# Patient Record
Sex: Female | Born: 1990 | Race: Black or African American | Hispanic: No | Marital: Single | Smoking: Former smoker
Health system: Southern US, Community
[De-identification: ages and names within clinical notes are randomized; demographics above are authoritative.]

## PROBLEM LIST (undated history)

## (undated) ENCOUNTER — Emergency Department (HOSPITAL_COMMUNITY): Disposition: A | Discharge status: Skilled Nursing Facility

## (undated) ENCOUNTER — Inpatient Hospital Stay (HOSPITAL_COMMUNITY): Payer: Self-pay

## (undated) DIAGNOSIS — T7840XA Allergy, unspecified, initial encounter: Secondary | ICD-10-CM

## (undated) HISTORY — DX: Allergy, unspecified, initial encounter: T78.40XA

---
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
## 2010-08-21 LAB — CBC
HCT: 28 % — ABNORMAL LOW (ref 36.0–46.0)
HCT: 41.6 % (ref 36.0–46.0)
Hemoglobin: 13.8 g/dL (ref 12.0–15.0)
Hemoglobin: 9.4 g/dL — ABNORMAL LOW (ref 12.0–15.0)
MCHC: 33.3 g/dL (ref 30.0–36.0)
MCHC: 33.5 g/dL (ref 30.0–36.0)
MCV: 95.6 fL (ref 78.0–100.0)
MCV: 96.3 fL (ref 78.0–100.0)
Platelets: 134 10*3/uL — ABNORMAL LOW (ref 150–400)
RBC: 2.91 MIL/uL — ABNORMAL LOW (ref 3.87–5.11)
RBC: 4.35 MIL/uL (ref 3.87–5.11)
RDW: 13.7 % (ref 11.5–15.5)
WBC: 10.5 10*3/uL (ref 4.0–10.5)

## 2010-08-21 LAB — CCBB MATERNAL DONOR DRAW

## 2010-11-20 IMAGING — US US OB FOLLOW-UP
1 series · 14 of 23 positions shown · non-contrast
Comparison: none

OBSTETRICAL ULTRASOUND:
 This ultrasound exam was performed in the [HOSPITAL] Ultrasound Department.  The OB US report was generated in the AS system, and faxed to the ordering physician.  This report is also available in [HOSPITAL]?s [employer] and in [REDACTED] PACS.

[Series 1: us ob follow up · 0.27mm/px · 23 acquisitions, 14 frames shown]
[im 1/23]
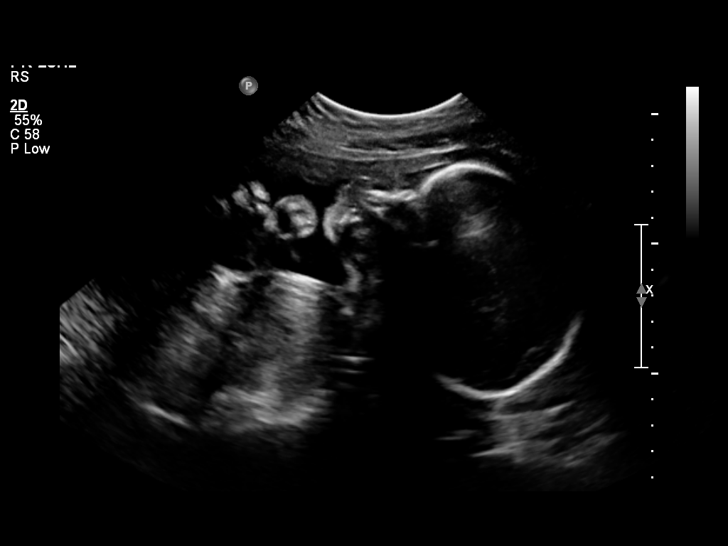
[im 3/23]
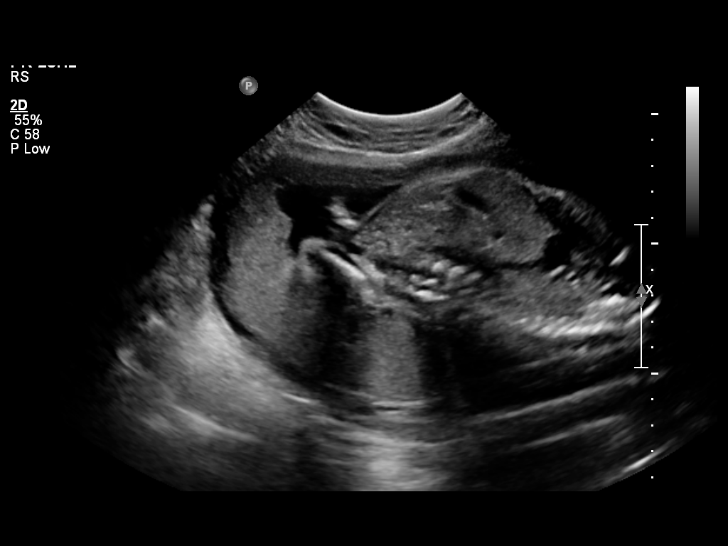
[im 5/23]
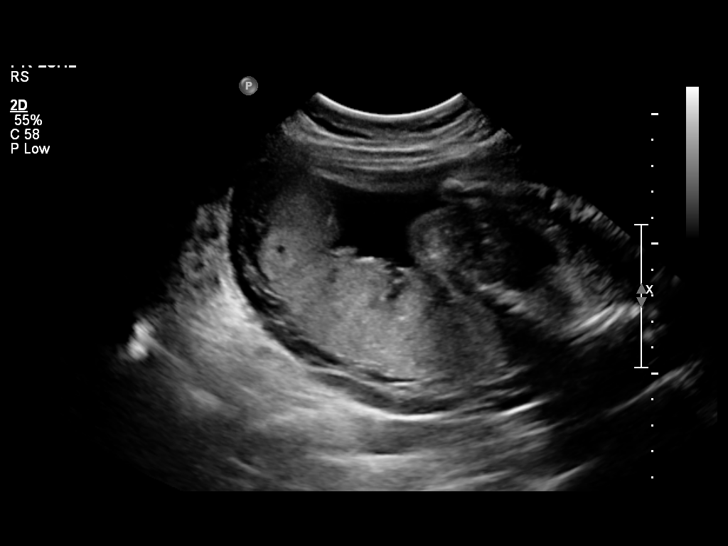
[im 6/23]
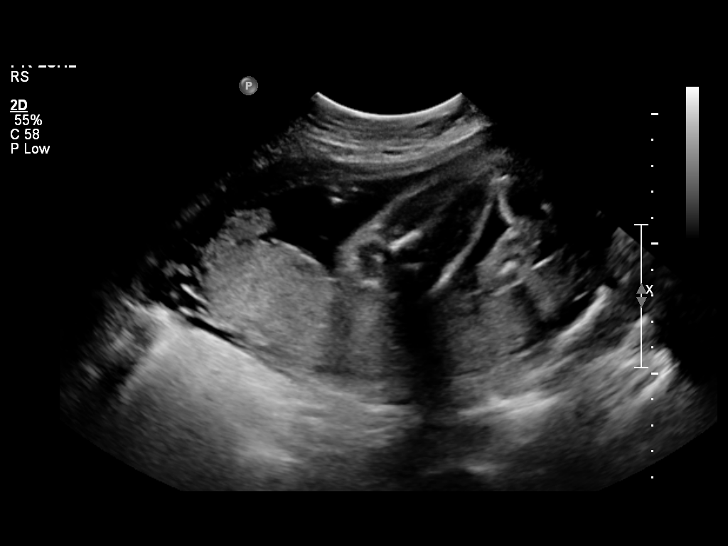
[im 8/23]
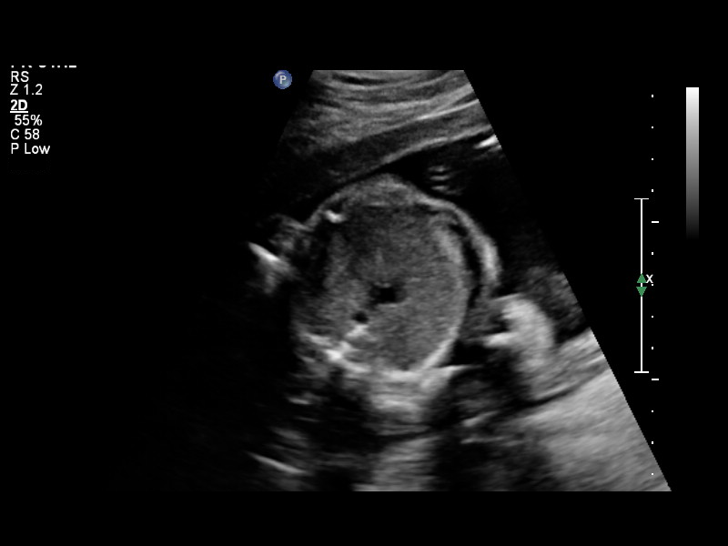
[im 10/23]
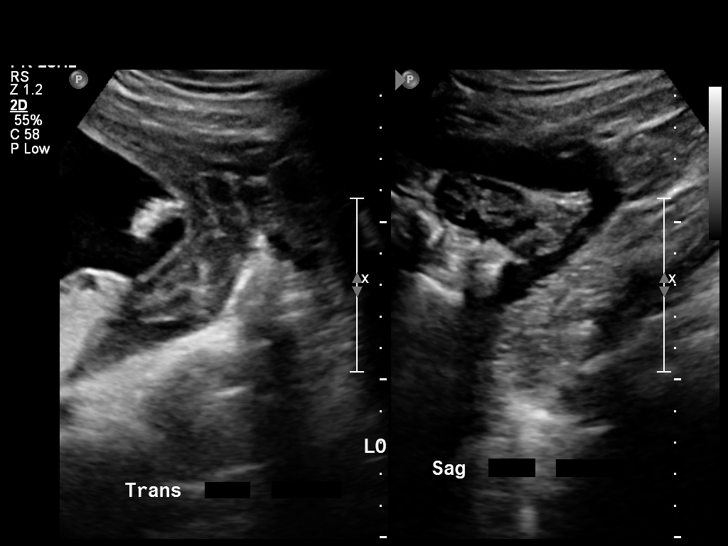
[im 11/23]
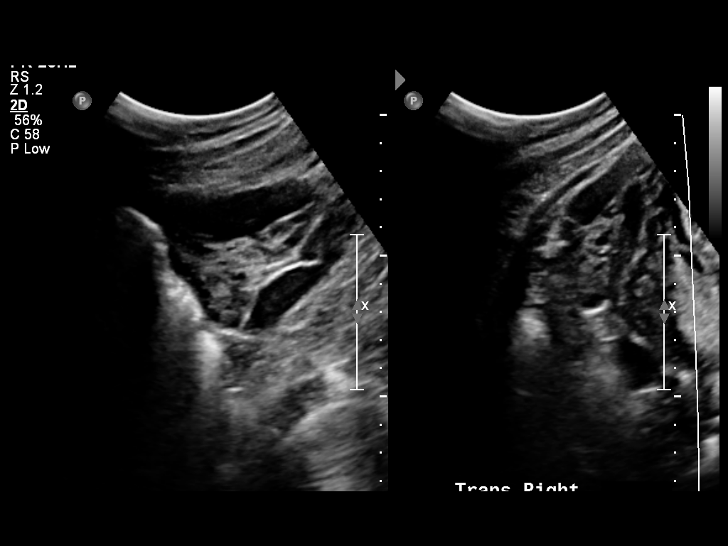
[im 13/23]
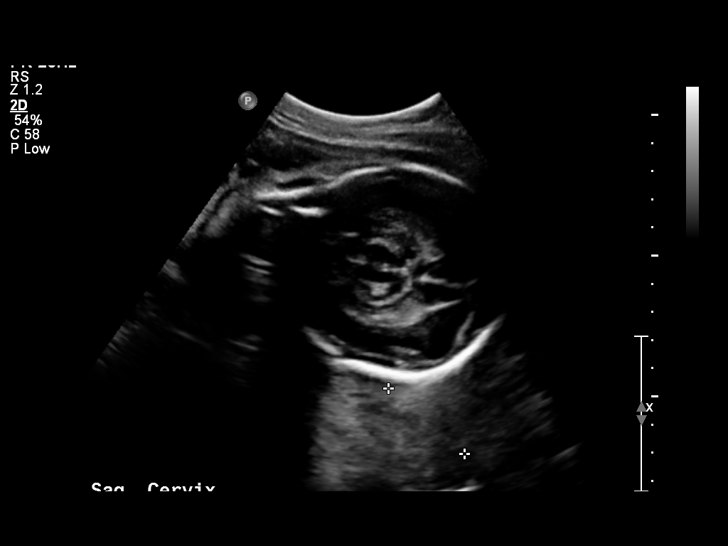
[im 14/23]
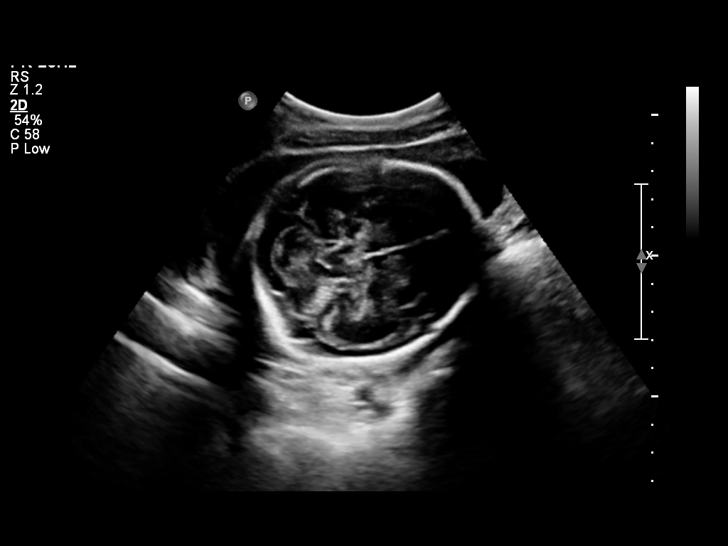
[im 16/23]
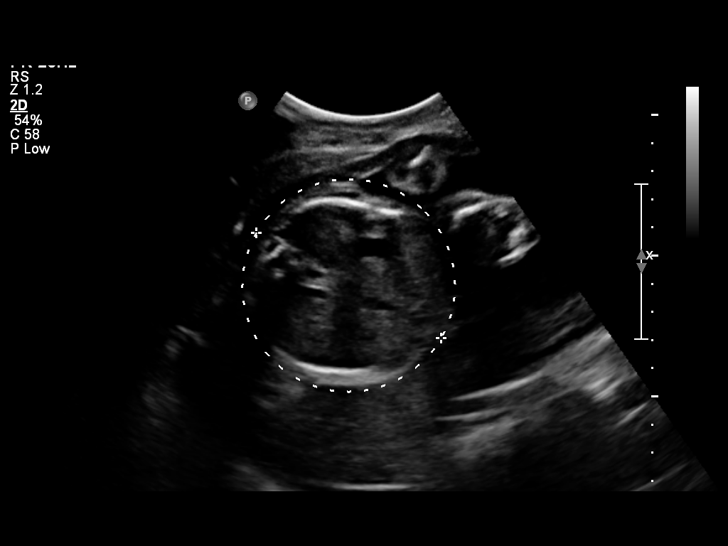
[im 18/23]
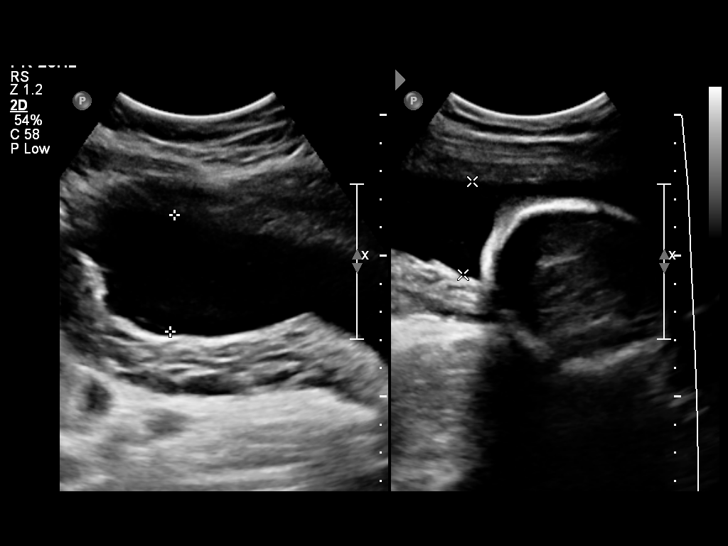
[im 19/23]
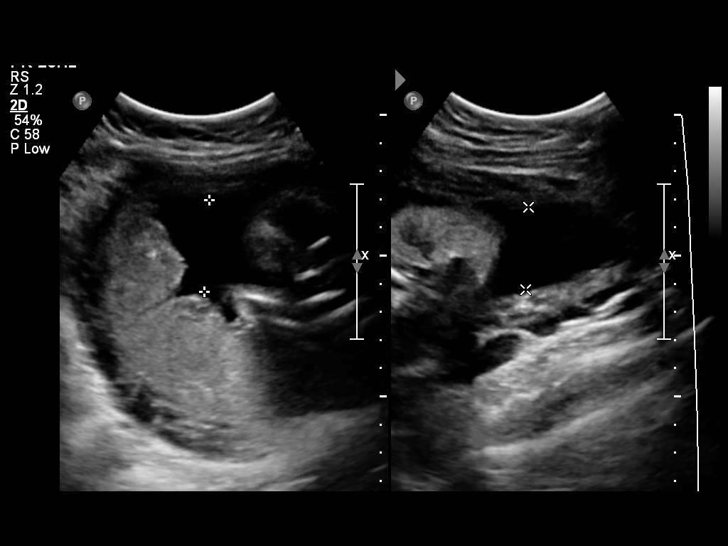
[im 21/23]
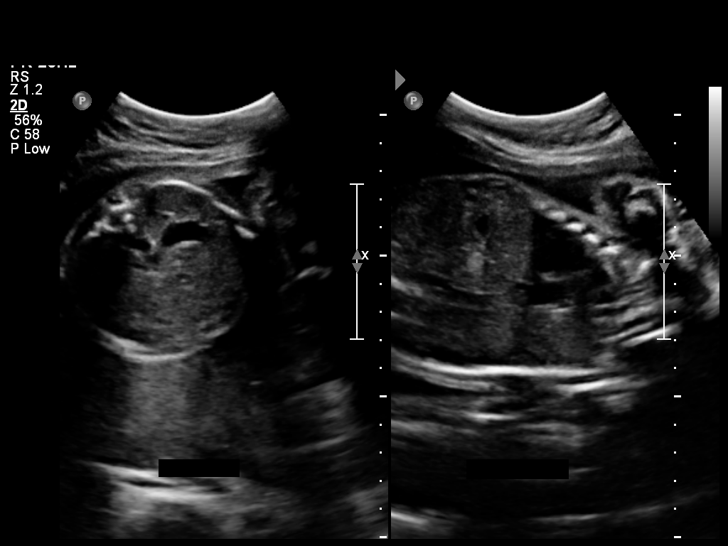
[im 23/23]
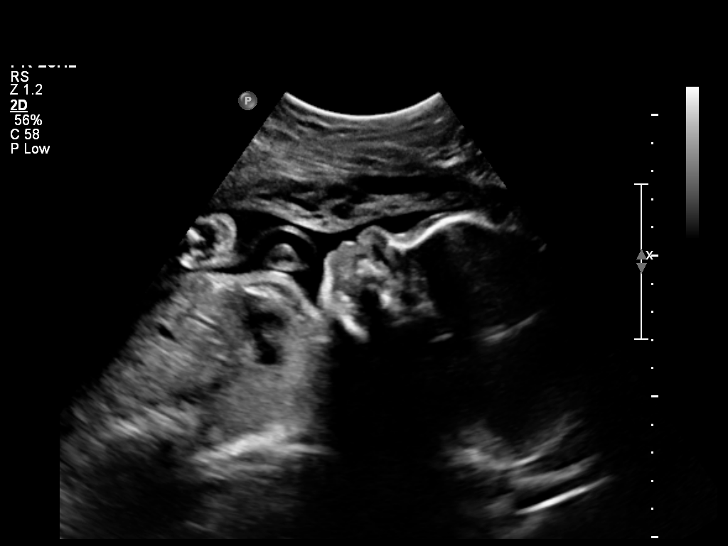

[14 of 23 positions shown; findings below may reference images not displayed]

IMPRESSION: See [facility] Obstetric US report.

## 2011-06-08 ENCOUNTER — Encounter: Payer: Self-pay | Admitting: Emergency Medicine

## 2011-06-08 ENCOUNTER — Emergency Department (HOSPITAL_COMMUNITY)
Admission: EM | Admit: 2011-06-08 | Discharge: 2011-06-09 | Disposition: A | Discharge status: Home / Self Care | Payer: Self-pay | Attending: Emergency Medicine | Admitting: Emergency Medicine

## 2011-06-08 DIAGNOSIS — R197 Diarrhea, unspecified: Secondary | ICD-10-CM | POA: Insufficient documentation

## 2011-06-08 DIAGNOSIS — R509 Fever, unspecified: Secondary | ICD-10-CM | POA: Insufficient documentation

## 2011-06-08 DIAGNOSIS — R112 Nausea with vomiting, unspecified: Secondary | ICD-10-CM | POA: Insufficient documentation

## 2011-06-08 DIAGNOSIS — F17200 Nicotine dependence, unspecified, uncomplicated: Secondary | ICD-10-CM | POA: Insufficient documentation

## 2011-06-08 DIAGNOSIS — N390 Urinary tract infection, site not specified: Secondary | ICD-10-CM | POA: Insufficient documentation

## 2011-06-08 DIAGNOSIS — R1084 Generalized abdominal pain: Secondary | ICD-10-CM | POA: Insufficient documentation

## 2011-06-08 DIAGNOSIS — M549 Dorsalgia, unspecified: Secondary | ICD-10-CM | POA: Insufficient documentation

## 2011-06-08 DIAGNOSIS — R300 Dysuria: Secondary | ICD-10-CM | POA: Insufficient documentation

## 2011-06-08 LAB — URINALYSIS, ROUTINE W REFLEX MICROSCOPIC
Bilirubin Urine: NEGATIVE
Glucose, UA: NEGATIVE mg/dL
Ketones, ur: >80 mg/dL — AB
Nitrite: POSITIVE — AB
Protein, ur: >300 mg/dL — AB

## 2011-06-08 LAB — HEPATIC FUNCTION PANEL
ALT: 19 U/L (ref 0–35)
Bilirubin, Direct: 0.1 mg/dL (ref 0.0–0.3)
Indirect Bilirubin: 0.3 mg/dL (ref 0.3–0.9)
Total Bilirubin: 0.4 mg/dL (ref 0.3–1.2)

## 2011-06-08 LAB — BASIC METABOLIC PANEL
BUN: 5 mg/dL — ABNORMAL LOW (ref 6–23)
CO2: 25 mEq/L (ref 19–32)
Calcium: 9.5 mg/dL (ref 8.4–10.5)
Creatinine, Ser: 0.7 mg/dL (ref 0.50–1.10)
Glucose, Bld: 104 mg/dL — ABNORMAL HIGH (ref 70–99)

## 2011-06-08 LAB — CBC
MCH: 32.2 pg (ref 26.0–34.0)
MCHC: 34.8 g/dL (ref 30.0–36.0)
MCV: 92.7 fL (ref 78.0–100.0)
Platelets: 189 10*3/uL (ref 150–400)
RDW: 12.6 % (ref 11.5–15.5)

## 2011-06-08 LAB — DIFFERENTIAL
Basophils Absolute: 0 10*3/uL (ref 0.0–0.1)
Eosinophils Absolute: 0 10*3/uL (ref 0.0–0.7)
Eosinophils Relative: 0 % (ref 0–5)
Monocytes Absolute: 1.1 10*3/uL — ABNORMAL HIGH (ref 0.1–1.0)

## 2011-06-08 LAB — URINE MICROSCOPIC-ADD ON

## 2011-06-08 MED ORDER — SODIUM CHLORIDE 0.9 % IV BOLUS (SEPSIS)
1000.0000 mL | Freq: Once | INTRAVENOUS | Status: AC
  Administered 2011-06-08: 1000 mL via INTRAVENOUS

## 2011-06-08 MED ORDER — ONDANSETRON HCL 4 MG/2ML IJ SOLN
4.0000 mg | Freq: Once | INTRAMUSCULAR | Status: AC
  Administered 2011-06-08: 4 mg via INTRAVENOUS
  Filled 2011-06-08: qty 2

## 2011-06-08 MED ORDER — DEXTROSE 5 % IV SOLN
1.0000 g | Freq: Once | INTRAVENOUS | Status: AC
  Administered 2011-06-08: 1 g via INTRAVENOUS
  Filled 2011-06-08: qty 10

## 2011-06-08 MED ORDER — MORPHINE SULFATE 4 MG/ML IJ SOLN: 4.0000 mg | Freq: Once | INTRAMUSCULAR | Status: DC

## 2011-06-08 MED ORDER — MORPHINE SULFATE 4 MG/ML IJ SOLN
4.0000 mg | Freq: Once | INTRAMUSCULAR | Status: AC
  Administered 2011-06-08: 4 mg via INTRAVENOUS
  Filled 2011-06-08: qty 1

## 2011-06-08 MED ORDER — ACETAMINOPHEN 325 MG PO TABS
650.0000 mg | ORAL_TABLET | Freq: Once | ORAL | Status: AC
  Administered 2011-06-08: 650 mg via ORAL
  Filled 2011-06-08: qty 2

## 2011-06-08 NOTE — ED Provider Notes (Signed)
History     CSN: [id number]  Arrival date & time 06/08/11  1916   First MD Initiated Contact with Patient 06/08/11 2057      Chief Complaint  Patient presents with  . Abdominal Pain   HPI Patient complains of generalized abdominal pain since last Thursday. Denies any current abdominal pain. Patient reports that she has had some nausea and vomiting. Reports some diarrhea. States that she has a fever. Denies any vaginal bleeding or discharge. Denies any pelvic pain. Patient denies any other complaints. Minimal back pain.   History reviewed. No pertinent past medical history.  History reviewed. No pertinent past surgical history.  No family history on file.  History  Substance Use Topics  . Smoking status: Current Everyday Smoker  . Smokeless tobacco: Not on file  . Alcohol Use: Yes    OB History    Grav Para Term Preterm Abortions TAB SAB Ect Mult Living                  Review of Systems  Constitutional: Positive for fever. Negative for chills, diaphoresis and appetite change.  HENT: Negative for neck pain.   Eyes: Negative for photophobia and visual disturbance.  Respiratory: Negative for cough, chest tightness and shortness of breath.   Cardiovascular: Negative for chest pain.  Gastrointestinal: Positive for nausea, vomiting, abdominal pain and diarrhea. Negative for blood in stool, abdominal distention and anal bleeding.  Genitourinary: Positive for dysuria. Negative for flank pain, vaginal bleeding, vaginal discharge and vaginal pain.  Musculoskeletal: Positive for back pain.  Skin: Negative for rash.  Neurological: Negative for weakness and numbness.  All other systems reviewed and are negative.    Allergies  Review of patient's allergies indicates no known allergies.  Home Medications  No current outpatient prescriptions on file.  BP 116/74  Pulse 120  Temp(Src) 100 F (37.8 C) (Oral)  Resp 18  SpO2 98%  LMP 05/08/2011  Physical Exam    Constitutional: She is oriented to person, place, and time. She appears well-developed and well-nourished. No distress.       Nontoxic appearing  HENT:  Head: Normocephalic and atraumatic.  Right Ear: External ear normal.  Mouth/Throat: Oropharynx is clear and moist.  Eyes: EOM are normal. Pupils are equal, round, and reactive to light.  Neck: Normal range of motion. Neck supple.  Cardiovascular: Normal rate, regular rhythm and normal heart sounds.  Exam reveals no gallop and no friction rub.   No murmur heard. Pulmonary/Chest: Effort normal and breath sounds normal. No respiratory distress. She has no wheezes. She has no rales. She exhibits no tenderness.  Abdominal: Soft. She exhibits no distension and no mass. There is no tenderness. There is no rebound and no guarding.       No pulsatile mass  Musculoskeletal: Normal range of motion. She exhibits no edema and no tenderness.       Negative straight leg test bilateral lower extremities. Baseline ROM, moves extremities with no obvious new focal weakness. Full strength in both lower extremities 5/5. No weakness with extension of great toe, L5 nerve room is not impaired. S1 tested and not impaired with testing ankle jerk reflex, good plantar flexion.  Lymphadenopathy:    She has no cervical adenopathy.  Neurological: She is alert and oriented to person, place, and time. She has normal strength. She displays no atrophy. No cranial nerve deficit or sensory deficit. She exhibits normal muscle tone. She displays a negative Romberg sign. Gait normal. She displays  no Babinski's sign on the right side. She displays no Babinski's sign on the left side.  Reflex Scores:      Patellar reflexes are 2+ on the right side and 2+ on the left side.      Achilles reflexes are 2+ on the right side and 2+ on the left side.       Advised patient of warning signs to return. Patient stated agreement and understanding.  Skin: Skin is warm and dry. No rash noted. She  is not diaphoretic. No erythema. No pallor.  Psychiatric: She has a normal mood and affect. Her behavior is normal. Judgment and thought content normal.    ED Course  Procedures (including critical care time)  Patient seen and evaluated.  VSS reviewed. . Nursing notes reviewed. Discussed with attending physician. Initial testing ordered. Will monitor the patient closely. They agree with the treatment plan and diagnosis. No other complaints.   Results for orders placed during the hospital encounter of 06/08/11  BASIC METABOLIC PANEL      Component Value Range   Sodium 132 (*) 135 - 145 (mEq/L)   Potassium 4.0  3.5 - 5.1 (mEq/L)   Chloride 96  96 - 112 (mEq/L)   CO2 25  19 - 32 (mEq/L)   Glucose, Bld 104 (*) 70 - 99 (mg/dL)   BUN 5 (*) 6 - 23 (mg/dL)   Creatinine, Ser 1.61  0.50 - 1.10 (mg/dL)   Calcium 9.5  8.4 - 09.6 (mg/dL)   GFR calc non Af Amer >90  >90 (mL/min)   GFR calc Af Amer >90  >90 (mL/min)  CBC      Component Value Range   WBC 17.2 (*) 4.0 - 10.5 (K/uL)   RBC 4.22  3.87 - 5.11 (MIL/uL)   Hemoglobin 13.6  12.0 - 15.0 (g/dL)   HCT 04.5  40.9 - 81.1 (%)   MCV 92.7  78.0 - 100.0 (fL)   MCH 32.2  26.0 - 34.0 (pg)   MCHC 34.8  30.0 - 36.0 (g/dL)   RDW 91.4  78.2 - 95.6 (%)   Platelets 189  150 - 400 (K/uL)  DIFFERENTIAL      Component Value Range   Neutrophils Relative 85 (*) 43 - 77 (%)   Neutro Abs 14.6 (*) 1.7 - 7.7 (K/uL)   Lymphocytes Relative 8 (*) 12 - 46 (%)   Lymphs Abs 1.4  0.7 - 4.0 (K/uL)   Monocytes Relative 7  3 - 12 (%)   Monocytes Absolute 1.1 (*) 0.1 - 1.0 (K/uL)   Eosinophils Relative 0  0 - 5 (%)   Eosinophils Absolute 0.0  0.0 - 0.7 (K/uL)   Basophils Relative 0  0 - 1 (%)   Basophils Absolute 0.0  0.0 - 0.1 (K/uL)  URINALYSIS, ROUTINE W REFLEX MICROSCOPIC      Component Value Range   Color, Urine AMBER (*) YELLOW    APPearance TURBID (*) CLEAR    Specific Gravity, Urine 1.024  1.005 - 1.030    pH 6.0  5.0 - 8.0    Glucose, UA NEGATIVE   NEGATIVE (mg/dL)   Hgb urine dipstick SMALL (*) NEGATIVE    Bilirubin Urine NEGATIVE  NEGATIVE    Ketones, ur >80 (*) NEGATIVE (mg/dL)   Protein, ur >213 (*) NEGATIVE (mg/dL)   Urobilinogen, UA 1.0  0.0 - 1.0 (mg/dL)   Nitrite POSITIVE (*) NEGATIVE    Leukocytes, UA MODERATE (*) NEGATIVE   HEPATIC FUNCTION PANEL  Component Value Range   Total Protein 8.1  6.0 - 8.3 (g/dL)   Albumin 3.6  3.5 - 5.2 (g/dL)   AST 18  0 - 37 (U/L)   ALT 19  0 - 35 (U/L)   Alkaline Phosphatase 83  39 - 117 (U/L)   Total Bilirubin 0.4  0.3 - 1.2 (mg/dL)   Bilirubin, Direct 0.1  0.0 - 0.3 (mg/dL)   Indirect Bilirubin 0.3  0.3 - 0.9 (mg/dL)  LIPASE, BLOOD      Component Value Range   Lipase 19  11 - 59 (U/L)  POCT PREGNANCY, URINE      Component Value Range   Preg Test, Ur NEGATIVE    URINE MICROSCOPIC-ADD ON      Component Value Range   Squamous Epithelial / LPF FEW (*) RARE    WBC, UA 21-50  <3 (WBC/hpf)   RBC / HPF 3-6  <3 (RBC/hpf)   Bacteria, UA MANY (*) RARE    Urine-Other MUCOUS PRESENT     No results found.  Patient seen and re-evaluated. Resting comfortably. VSS stable. NAD. Patient notified of testing results. Stated agreement and understanding. Patient stated understanding to treatment plan and diagnosis.    10:24 PM urine pregnancy negative.   MDM  UTI, treated with IV rocephin in the ED. No renal insufficiency. Advised patient to follow up with her primary care physician for recheck UA in one week. BP 102/63  Pulse 95  Temp(Src) 100.1 F (37.8 C) (Oral)  Resp 18  SpO2 100%  LMP 05/08/2011 fever improved.          Demetrius Charity, PA 06/08/11 [phone]

## 2011-06-08 NOTE — ED Notes (Signed)
Pt states she has been having abdominal pain with N/V four the past four days.  Pt states she has been vomiting today with diarrhea about 4 times.  Pt denies taking any medication today.

## 2011-06-08 NOTE — ED Notes (Signed)
PT. REPORTS GENERALIZED ABDOMINAL PAIN ONSET LAST Thursday ,  VOMITTED AND DIARRHEA TODAY WITH HEADACHE AND FEVER.

## 2011-06-09 MED ORDER — OXYCODONE-ACETAMINOPHEN 5-325 MG PO TABS
1.0000 | ORAL_TABLET | ORAL | Status: AC | PRN
Start: 2011-06-09 — End: 2011-06-19

## 2011-06-09 MED ORDER — CIPROFLOXACIN HCL 500 MG PO TABS: 500.0000 mg | ORAL_TABLET | Freq: Two times a day (BID) | ORAL | Status: AC

## 2011-06-09 MED ORDER — PROMETHAZINE HCL 25 MG PO TABS: 25.0000 mg | ORAL_TABLET | Freq: Four times a day (QID) | ORAL | Status: AC | PRN

## 2011-06-20 NOTE — ED Provider Notes (Signed)
Medical screening examination/treatment/procedure(s) were performed by non-physician practitioner and as supervising physician I was immediately available for consultation/collaboration.    Madden Garron L Skarleth Delmonico, MD 06/20/11 2245 

## 2014-11-30 ENCOUNTER — Encounter (HOSPITAL_COMMUNITY): Payer: Self-pay

## 2014-11-30 ENCOUNTER — Emergency Department (HOSPITAL_COMMUNITY)
Admission: EM | Admit: 2014-11-30 | Discharge: 2014-11-30 | Disposition: A | Discharge status: Home / Self Care | Payer: No Typology Code available for payment source | Attending: Emergency Medicine | Admitting: Emergency Medicine

## 2014-11-30 DIAGNOSIS — Z720 Tobacco use: Secondary | ICD-10-CM | POA: Insufficient documentation

## 2014-11-30 DIAGNOSIS — Y998 Other external cause status: Secondary | ICD-10-CM | POA: Insufficient documentation

## 2014-11-30 DIAGNOSIS — Y9389 Activity, other specified: Secondary | ICD-10-CM | POA: Insufficient documentation

## 2014-11-30 DIAGNOSIS — S199XXA Unspecified injury of neck, initial encounter: Secondary | ICD-10-CM | POA: Diagnosis present

## 2014-11-30 DIAGNOSIS — M5489 Other dorsalgia: Secondary | ICD-10-CM

## 2014-11-30 DIAGNOSIS — S161XXA Strain of muscle, fascia and tendon at neck level, initial encounter: Secondary | ICD-10-CM | POA: Insufficient documentation

## 2014-11-30 DIAGNOSIS — Y92410 Unspecified street and highway as the place of occurrence of the external cause: Secondary | ICD-10-CM | POA: Diagnosis not present

## 2014-11-30 DIAGNOSIS — S3992XA Unspecified injury of lower back, initial encounter: Secondary | ICD-10-CM | POA: Insufficient documentation

## 2014-11-30 MED ORDER — NAPROXEN 250 MG PO TABS
500.0000 mg | ORAL_TABLET | Freq: Once | ORAL | Status: AC
  Administered 2014-11-30: 500 mg via ORAL
  Filled 2014-11-30: qty 2

## 2014-11-30 MED ORDER — METHOCARBAMOL 500 MG PO TABS
500.0000 mg | ORAL_TABLET | Freq: Once | ORAL | Status: AC
  Administered 2014-11-30: 500 mg via ORAL
  Filled 2014-11-30: qty 1

## 2014-11-30 MED ORDER — NAPROXEN 500 MG PO TABS: 500.0000 mg | ORAL_TABLET | Freq: Two times a day (BID) | ORAL | Status: DC

## 2014-11-30 MED ORDER — METHOCARBAMOL 500 MG PO TABS: 500.0000 mg | ORAL_TABLET | Freq: Two times a day (BID) | ORAL | Status: DC
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
## 2014-11-30 NOTE — Discharge Instructions (Signed)
1. Medications: robaxin, naproxyn, usual home medications 2. Treatment: rest, drink plenty of fluids, gentle stretching as discussed, alternate ice and heat 3. Follow Up: Please followup with your primary doctor in 3 days for discussion of your diagnoses and further evaluation after today's visit; if you do not have a primary care doctor use the resource guide provided to find one;  Return to the ER for worsening back pain, difficulty walking, loss of bowel or bladder control or other concerning symptoms     Back Exercises Back exercises help treat and prevent back injuries. The goal is to increase your strength in your belly (abdominal) and back muscles. These exercises can also help with flexibility. Start these exercises when told by your doctor. HOME CARE Back exercises include: Pelvic Tilt.  Lie on your back with your knees bent. Tilt your pelvis until the lower part of your back is against the floor. Hold this position 5 to 10 sec. Repeat this exercise 5 to 10 times. Knee to Chest.  Pull 1 knee up against your chest and hold for 20 to 30 seconds. Repeat this with the other knee. This may be done with the other leg straight or bent, whichever feels better. Then, pull both knees up against your chest. Sit-Ups or Curl-Ups.  Bend your knees 90 degrees. Start with tilting your pelvis, and do a partial, slow sit-up. Only lift your upper half 30 to 45 degrees off the floor. Take at least 2 to 3 seonds for each sit-up. Do not do sit-ups with your knees out straight. If partial sit-ups are difficult, simply do the above but with only tightening your belly (abdominal) muscles and holding it as told. Hip-Lift.  Lie on your back with your knees flexed 90 degrees. Push down with your feet and shoulders as you raise your hips 2 inches off the floor. Hold for 10 seconds, repeat 5 to 10 times. Back Arches.  Lie on your stomach. Prop yourself up on bent elbows. Slowly press on your hands, causing an  arch in your low back. Repeat 3 to 5 times. Shoulder-Lifts.  Lie face down with arms beside your body. Keep hips and belly pressed to floor as you slowly lift your head and shoulders off the floor. Do not overdo your exercises. Be careful in the beginning. Exercises may cause you some mild back discomfort. If the pain lasts for more than 15 minutes, stop the exercises until you see your doctor. Improvement with exercise for back problems is slow.  Document Released: 06/21/2010 Document Revised: 08/11/2011 Document Reviewed: 03/20/2011 [facility] Patient Information 2015 [place], [place]. This information is not intended to replace advice given to you by your health care provider. Make sure you discuss any questions you have with your health care provider.    Emergency Department Resource Guide 1) Find a Doctor and Pay Out of Pocket Although you won't have to find out who is covered by your insurance plan, it is a good idea to ask around and get recommendations. You will then need to call the office and see if the doctor you have chosen will accept you as a new patient and what types of options they offer for patients who are self-pay. Some doctors offer discounts or will set up payment plans for their patients who do not have insurance, but you will need to ask so you aren't surprised when you get to your appointment.  2) Contact Your Local Health Department Not all health departments have doctors that can see  patients for sick visits, but many do, so it is worth a call to see if yours does. If you don't know where your local health department is, you can check in your phone book. The CDC also has a tool to help you locate your state's health department, and many state websites also have listings of all of their local health departments.  3) Find a [facility] If your illness is not likely to be very severe or complicated, you may want to try a walk in clinic. These are popping up all over the  country in pharmacies, drugstores, and shopping centers. They're usually staffed by nurse practitioners or physician assistants that have been trained to treat common illnesses and complaints. They're usually fairly quick and inexpensive. However, if you have serious medical issues or chronic medical problems, these are probably not your best option.  No Primary Care Doctor: - Call Health Connect at  [phone] - they can help you locate a primary care doctor that  accepts your insurance, provides certain services, etc. - Physician Referral Service- [phone]  Chronic Pain Problems: Organization         Address  Phone   Notes  [facility]  [phone] Patients need to be referred by their primary care doctor.   Medication Assistance: Organization         Address  Phone   Notes  [facility] Medication [facility] [address] [place], [place] [address] [phone] --Must be a resident of [facility] -- Must have NO insurance coverage whatsoever (no Medicaid/ Medicare, etc.) -- The pt. MUST have a primary care doctor that directs their care regularly and follows them in the community   MedAssist  [phone]   [employer]  [phone]    Agencies that provide inexpensive medical care: Organization         Address  Phone   Notes  [place]  [phone]   [facility]    [phone]   [facility] [place], [place] [address] [phone]   [place] [address] [place] [phone]   Planned Parenthood    [phone]   [facility]    [phone]   [place] and [place] [address], [place] Phone:  [phone], Fax:  [phone] Hours of Operation:  9 am - 6 pm, M-F.  Also accepts Medicaid/Medicare and self-pay.  [facility] for [place], [address], [place] Phone: [phone], Fax: [phone]. Hours of Operation:  8:30 am - 5:30 pm, M-F.  Also accepts Medicaid and self-pay.  [facility] [place] [address], [place] Phone: [phone]   [place], [place], [place] [phone], Ext. [phone] Mondays & Thursdays: 7-9 AM.  First 15 patients are seen on a first come, first serve basis.    [place] Providers:  Organization         Address  Phone   Notes  [facility] [address], [place] [phone] Also accepts self-pay patients.  [place], [place]  [phone]   [place], [address], [place] [phone]   [place] [address], [place] (  [place]) [place] [address], [place]   [phone] Only accepts [place] Access [place] patients after they have their name applied to their card.   Self-Pay (no insurance) in [facility]:  Organization         Address  Phone   Notes  Sickle Cell Patients, [facility] [place] [phone]   [facility] [place] [phone]   [facility] [place]  [place], [place], [place] [phone]   [facility]/Dr. Osei-Bonsu  [address] [place] or [address], [place] [phone] Phone number for both [place] and [place] locations is the same.  Urgent Medical and [facility] [address], [place] [phone]   [facility] [address] [place] or [address] [phone]   [facility] [address] [place] [phone], phone; [phone], fax Sees patients 1st and 3rd Saturday of every month.  Must  not qualify for public or private insurance (i.e. Medicaid, Medicare, [place] Health Choice, Veterans' Benefits)  Household income should be no more than 200% of the poverty level The clinic cannot treat you if you are pregnant or think you are pregnant  Sexually transmitted diseases are not treated at the clinic.    Dental Care: Organization         Address  Phone  Notes  [facility] of [facility] [place] [phone] Accepts children up to age 26 who are enrolled in [place] or [place]; pregnant women with a Medicaid card; and children who have applied for Medicaid or [place] Health Choice, but were declined, whose parents can pay a reduced fee at time of service.  [facility] Department of [facility]  [address], [place] [phone] Accepts children up to age 28 who are enrolled in [place] or [place]; pregnant women with a Medicaid card; and children who have applied for Medicaid or [place] Health Choice, but were declined, whose parents can pay a reduced fee at time of service.  [place] Adult Dental Access PROGRAM  [place] [phone] Patients are seen by appointment only. Walk-ins are not accepted. [place] will see patients 73 years of age and older. Monday - Tuesday (8am-5pm) Most Wednesdays (8:30-5pm) $30 per visit, cash only  [facility] Adult Dental Access PROGRAM  [address], [facility] [phone] Patients are seen by appointment only. Walk-ins are not accepted. [place] will see patients 74 years of age and older. One Wednesday Evening (Monthly: Volunteer Based).  $30 per visit, cash only  [place]  [phone] for adults; Children under age 39, call Graduate Pediatric Dentistry at [phone]. Children aged 7-14, please call [phone] to request a pediatric application.  Dental services are  provided in all areas of dental care including fillings, crowns and bridges, complete and partial dentures, implants, gum treatment, root canals, and extractions. Preventive care is also provided. Treatment is provided to both adults and children. Patients are selected via a lottery and there is often a waiting list.   [facility] [address] [place]  [phone] [email or web address], [place]  [place], [place] [phone], Ext. [phone] Second and Fourth Thursday of each month, opens at 6:30 AM; Clinic ends at 9 AM.  Patients are seen on a first-come first-served basis, and a limited number are seen during each clinic.   [facility]  [address] Hillard Danker [place], [place] [phone]   Eligibility Requirements You must have lived in [place], [place], or [place] counties for at least the last three months.   You cannot be eligible for state or federal sponsored [employer], including [employer], [place], or [employer].   You generally cannot be eligible for healthcare insurance through your employer.    How to apply: Eligibility screenings are held every Tuesday and Wednesday afternoon from 1:00 pm until 4:00 pm. You do not need an appointment for the interview!  [facility] [address], [place], [place]   [place]  [place] Department  [place]  [phone]    Behavioral Health Resources in the Community: Intensive Outpatient Programs Organization         Address  Phone  Notes  [place]. [address], [place], [place] [phone]   [facility] Outpatient [address], [place], [place]   ADS: Alcohol & Drug Svcs [address] [place], [place]   [place] [address]  [place], [place] or [phone]   Substance Abuse Resources Organization         Address  Phone  Notes  Alcohol and Drug Services  [phone]   [place]  [phone]   The [place]   Chinita Pester  [phone]   Residential & Outpatient Substance Abuse Program  [phone]   Psychological Services Organization         Address  Phone  Notes  [facility]  [place]  [phone]   [place] [address], [place] or [phone]    Mobile Crisis Teams Organization         Address  Phone  Notes  [facility], Mobile Crisis Care Unit  [phone]   Assertive Psychotherapeutic Services  [address] [place], [place]   [place] Levels [address] [place] [phone]    Self-Help/Support Groups Organization         Address  Phone             Notes  [place]. of [place] - variety of support groups  [place] Call for more information  Narcotics Anonymous (NA), [facility]aring [facility] [address], [employer] [place]  2 meetings at this location   Special educational needs teacher         Address  Phone  Notes  AS[facility] Residential Treatment [place],    Startup  [phone]   [facility]  [address] [place] [phone], [place], [place]   [place], [place] [phone] Admissions: 8am-3pm M-F  Incentives Substance [place] [address]    [place], [place] [phone]   [facility] [address] Jadene Pierini [place], [place]   The [place].,  [place], [place] - Intensive Outpatient [address], [place], [place]   [place] (  [facility]iction [facility] [place].,  [place], [place] [phone] or  [phone]   Residential Treatment Services (RTS) [address] [place], [place] Accepts Medicaid  [facility] [place] [address]  [place] [phone] Substance Abuse/Addiction Treatment   [facility] Organization         Address  Phone  Notes  [facility]  [phone]   Domenic Schwab, PhD [address] [place], [place]   [phone] or [phone]   [place], [place] [phone]   [address], [place], [place] [phone] Insurance/Medicaid/sponsorship through [facility] and Families [address] [place]                                    [place], [place] [phone] [place] [address], [place] [phone]    Dr. Adele Schilder  [phone]   [facility] [place] Dept. 1) [address], [place] 2) Mobile 3)  [address], [place] [phone]  [phone]   [place] [phone] or [phone] (After Hours)

## 2015-09-27 ENCOUNTER — Ambulatory Visit: Payer: Medicaid Other | Admitting: Obstetrics

## 2015-11-06 ENCOUNTER — Encounter: Payer: Self-pay | Admitting: Certified Nurse Midwife

## 2015-11-06 ENCOUNTER — Ambulatory Visit (INDEPENDENT_AMBULATORY_CARE_PROVIDER_SITE_OTHER): Payer: Medicaid Other | Admitting: Certified Nurse Midwife

## 2015-11-06 VITALS — BP 98/65 | HR 75 | Temp 98.5°F

## 2015-11-06 DIAGNOSIS — Z7689 Persons encountering health services in other specified circumstances: Secondary | ICD-10-CM

## 2015-11-06 DIAGNOSIS — N898 Other specified noninflammatory disorders of vagina: Secondary | ICD-10-CM

## 2015-11-06 DIAGNOSIS — T839XXA Unspecified complication of genitourinary prosthetic device, implant and graft, initial encounter: Secondary | ICD-10-CM | POA: Insufficient documentation

## 2015-11-06 DIAGNOSIS — Z1389 Encounter for screening for other disorder: Secondary | ICD-10-CM | POA: Diagnosis not present

## 2015-11-06 DIAGNOSIS — T8339XA Other mechanical complication of intrauterine contraceptive device, initial encounter: Secondary | ICD-10-CM

## 2015-11-06 DIAGNOSIS — T8389XA Other specified complication of genitourinary prosthetic devices, implants and grafts, initial encounter: Secondary | ICD-10-CM

## 2015-11-06 LAB — POCT URINALYSIS DIPSTICK
Bilirubin, UA: NEGATIVE
Glucose, UA: NEGATIVE
Ketones, UA: NEGATIVE
LEUKOCYTES UA: NEGATIVE
NITRITE UA: NEGATIVE
PH UA: 7
PROTEIN UA: NEGATIVE
Spec Grav, UA: <=1.005
UROBILINOGEN UA: NEGATIVE

## 2015-11-06 NOTE — Progress Notes (Signed)
Patient ID: Jennifer Roy, female   DOB: September 29, 1990, 25 y.o.   MRN: [id number]   Chief Complaint  Patient presents with  . Establish Care    HPI Jennifer Roy is a 25 y.o. female.  Patient states that she cannot find her strings.  States that she has regular periods with her IUD.  IUD was placed at the health department about 5 years ago.  Patient desires removal of IUD.  States that she is sexually active, lesbian relationship.  States that she has nausea with the IUD that comes and goes at random times and depression symptoms.      HPI  Past Medical History  Diagnosis Date  . Allergy     SEASONAL    Past Surgical History  Procedure Laterality Date  . Cesarean section      History reviewed. No pertinent family history.  Social History Social History  Substance Use Topics  . Smoking status: Current Every Day Smoker  . Smokeless tobacco: None  . Alcohol Use: 0.0 oz/week    0 Standard drinks or equivalent per week    Allergies  Allergen Reactions  . Other     Seasonal Allergies     No current outpatient prescriptions on file.   No current facility-administered medications for this visit.    Review of Systems Review of Systems Constitutional: negative for fatigue and weight loss Respiratory: negative for cough and wheezing Cardiovascular: negative for chest pain, fatigue and palpitations Gastrointestinal: negative for abdominal pain and change in bowel habits Genitourinary:negative, IUD strings not palpable Integument/breast: negative for nipple discharge Musculoskeletal:negative for myalgias Neurological: negative for gait problems and tremors Behavioral/Psych: negative for abusive relationship, depression Endocrine: negative for temperature intolerance     Blood pressure 98/65, pulse 75, temperature 98.5 F (36.9 C), last menstrual period 11/04/2015.  Physical Exam Physical Exam General:   alert  Skin:   no rash or abnormalities  Lungs:   clear to  auscultation bilaterally  Heart:   regular rate and rhythm, S1, S2 normal, no murmur, click, rub or gallop  Breasts:   deferred  Abdomen:  normal findings: no organomegaly, soft, non-tender and no hernia  Pelvis:  External genitalia: normal general appearance Urinary system: urethral meatus normal and bladder without fullness, nontender Vaginal: normal without tenderness, induration or masses, + white chunky discharge Cervix: normal appearance, IUD strings not present Adnexa: normal bimanual exam Uterus: anteverted and non-tender, normal size    50% of 15 min visit spent on counseling and coordination of care.   Data Reviewed Previous medical hx, meds  Assessment     IUD strings not present on exam  Vulvovaginal candidasis    Plan    Orders Placed This Encounter  Procedures  . US Transvaginal Non-OB    Standing Status: Future     Number of Occurrences:      Standing Expiration Date: 01/05/2017    Order Specific Question:  Reason for Exam (SYMPTOM  OR DIAGNOSIS REQUIRED)    Answer:  IUD strings not present on exam    Order Specific Question:  Preferred imaging location?    Answer:  Internal  . US Pelvis Complete    Standing Status: Future     Number of Occurrences:      Standing Expiration Date: 01/05/2017    Order Specific Question:  Reason for Exam (SYMPTOM  OR DIAGNOSIS REQUIRED)    Answer:  IUD strings not present on exam    Order Specific Question:  Preferred imaging location?  Answer:  Internal  . NuSwab Vaginitis Plus (VG+)  . POCT urinalysis dipstick   No orders of the defined types were placed in this encounter.    Need to obtain previous records Follow up after pelvic US and for annual exam with IUD removal

## 2015-11-08 ENCOUNTER — Other Ambulatory Visit: Payer: Self-pay | Admitting: Certified Nurse Midwife

## 2015-11-08 DIAGNOSIS — N760 Acute vaginitis: Principal | ICD-10-CM

## 2015-11-08 DIAGNOSIS — B9689 Other specified bacterial agents as the cause of diseases classified elsewhere: Secondary | ICD-10-CM

## 2015-11-08 MED ORDER — METRONIDAZOLE 500 MG PO TABS: 500.0000 mg | ORAL_TABLET | Freq: Two times a day (BID) | ORAL | Status: DC

## 2015-11-09 LAB — NUSWAB VAGINITIS PLUS (VG+)
Atopobium vaginae: HIGH Score — AB
BVAB 2: HIGH Score — AB
CANDIDA ALBICANS, NAA: NEGATIVE
CHLAMYDIA TRACHOMATIS, NAA: NEGATIVE
Candida glabrata, NAA: NEGATIVE
MEGASPHAERA 1: HIGH {score} — AB
Neisseria gonorrhoeae, NAA: NEGATIVE
TRICH VAG BY NAA: NEGATIVE

## 2015-11-12 ENCOUNTER — Encounter: Payer: Self-pay | Admitting: *Deleted

## 2015-11-15 ENCOUNTER — Ambulatory Visit (INDEPENDENT_AMBULATORY_CARE_PROVIDER_SITE_OTHER): Payer: Medicaid Other

## 2015-11-15 DIAGNOSIS — Z30431 Encounter for routine checking of intrauterine contraceptive device: Secondary | ICD-10-CM

## 2015-11-15 DIAGNOSIS — T8389XA Other specified complication of genitourinary prosthetic devices, implants and grafts, initial encounter: Secondary | ICD-10-CM

## 2015-11-16 ENCOUNTER — Ambulatory Visit: Payer: Medicaid Other | Admitting: Certified Nurse Midwife

## 2015-12-12 ENCOUNTER — Encounter: Payer: Self-pay | Admitting: Certified Nurse Midwife

## 2015-12-12 ENCOUNTER — Ambulatory Visit (INDEPENDENT_AMBULATORY_CARE_PROVIDER_SITE_OTHER): Payer: Medicaid Other | Admitting: Certified Nurse Midwife

## 2015-12-12 VITALS — BP 90/68 | HR 67 | Temp 98.8°F | Wt 131.4 lb

## 2015-12-12 DIAGNOSIS — R922 Inconclusive mammogram: Secondary | ICD-10-CM

## 2015-12-12 DIAGNOSIS — N6310 Unspecified lump in the right breast, unspecified quadrant: Secondary | ICD-10-CM

## 2015-12-12 DIAGNOSIS — Z30432 Encounter for removal of intrauterine contraceptive device: Secondary | ICD-10-CM

## 2015-12-12 DIAGNOSIS — Z0000 Encounter for general adult medical examination without abnormal findings: Secondary | ICD-10-CM | POA: Diagnosis not present

## 2015-12-12 DIAGNOSIS — Z01419 Encounter for gynecological examination (general) (routine) without abnormal findings: Secondary | ICD-10-CM | POA: Diagnosis not present

## 2015-12-12 DIAGNOSIS — Z113 Encounter for screening for infections with a predominantly sexual mode of transmission: Secondary | ICD-10-CM

## 2015-12-12 NOTE — Addendum Note (Signed)
Addended by: Marya LandryFOSTER, Beverly Suriano D on: 12/12/2015 04:50 PM   Modules accepted: Orders

## 2015-12-12 NOTE — Progress Notes (Signed)
Patient ID: Jennifer Roy, female   DOB: 11/29/1990, 25 y.o.   MRN: [id number]  [facility]   Reasons  for removal:  Time for removal, does not need for contraception.  Is having regular periods.     A timeout was performed confirming the patient, the procedure and allergy status. The patient was placed in the lithotomy position, a speculum was placed.  Cervix and strings were not visualized.  Tenaculum applied to anterior cervix. Long forceps used in a strile manner.  Dr. Clearance CootsHarper used long forceps to grasp stings with long forceps and device removed intact.  The patient tolerated the procedure well.  New contraceptive method: no method, natural family planning (NFP) methods discussed.

## 2015-12-12 NOTE — Progress Notes (Signed)
Patient ID: Jennifer Salmonlicia Sergent, female   DOB: 12/25/1990, 25 y.o.   MRN: [id number]    Subjective:      Jennifer Roy is a 25 y.o. female here for a routine exam.  Current complaints: desires IUD removal today, does not desire pregnancy, in homosexual relationship.  Desires STD screening.    Personal health questionnaire:  Is patient Ashkenazi Jewish, have a family history of breast and/or ovarian cancer: no Is there a family history of uterine cancer diagnosed at age < 4850, gastrointestinal cancer, urinary tract cancer, family member who is a Personnel officerLynch syndrome-associated carrier: no Is the patient overweight and hypertensive, family history of diabetes, personal history of gestational diabetes, preeclampsia or PCOS: no Is patient over 4355, have PCOS,  family history of premature CHD under age 25, diabetes, smoke, have hypertension or peripheral artery disease:  no At any time, has a partner hit, kicked or otherwise hurt or frightened you?: no Over the past 2 weeks, have you felt down, depressed or hopeless?: no Over the past 2 weeks, have you felt little interest or pleasure in doing things?:no   Gynecologic History Patient's last menstrual period was 11/01/2015 (approximate). Contraception: abstinence and IUD Last Pap: unknown. Results were: normal according to the patient Last mammogram: N/A.   Obstetric History OB History  Gravida Para Term Preterm AB SAB TAB Ectopic Multiple Living  1 1 1       1     # Outcome Date GA Lbr Len/2nd Weight Sex Delivery Anes PTL Lv  1 Term               Past Medical History  Diagnosis Date  . Allergy     SEASONAL    Past Surgical History  Procedure Laterality Date  . Cesarean section       Current outpatient prescriptions:  .  metroNIDAZOLE (FLAGYL) 500 MG tablet, Take 1 tablet (500 mg total) by mouth 2 (two) times daily., Disp: 14 tablet, Rfl: 0 Allergies  Allergen Reactions  . Other     Seasonal Allergies     Social History  Substance  Use Topics  . Smoking status: Current Every Day Smoker -- 1.00 packs/day    Types: Cigarettes  . Smokeless tobacco: Not on file  . Alcohol Use: 0.0 oz/week    0 Standard drinks or equivalent per week     Comment: no alcoholsince last appointment    No family history on file.    Review of Systems  Constitutional: negative for fatigue and weight loss Respiratory: negative for cough and wheezing Cardiovascular: negative for chest pain, fatigue and palpitations Gastrointestinal: negative for abdominal pain and change in bowel habits Musculoskeletal:negative for myalgias Neurological: negative for gait problems and tremors Behavioral/Psych: negative for abusive relationship, depression Endocrine: negative for temperature intolerance   Genitourinary:negative for abnormal menstrual periods, genital lesions, hot flashes, sexual problems and vaginal discharge Integument/breast: negative for breast lump, breast tenderness, nipple discharge and skin lesion(s)    Objective:       BP 90/68 mmHg  Pulse 67  Temp(Src) 98.8 F (37.1 C)  Wt 131 lb 6.4 oz (59.603 kg)  LMP 11/01/2015 (Approximate) General:   alert  Skin:   no rash or abnormalities  Lungs:   clear to auscultation bilaterally  Heart:   regular rate and rhythm, S1, S2 normal, no murmur, click, rub or gallop  Breasts:   dense tissue suspicious mass on right side mobile under nipple around 3 cm in size, no skin or nipple  changes or axillary nodes  Abdomen:  normal findings: no organomegaly, soft, non-tender and no hernia  Pelvis:  External genitalia: normal general appearance Urinary system: urethral meatus normal and bladder without fullness, nontender Vaginal: normal without tenderness, induration or masses Cervix: normal appearance Adnexa: normal bimanual exam Uterus: anteverted and non-tender, normal size   Lab Review Urine pregnancy test Labs reviewed yes Radiologic studies reviewed yes  50% of 45 min visit spent on  counseling and coordination of care.   Assessment:    Healthy female exam.   Breast mass right breast,   dense breast tissue  STD screening exam  Plan:    Education reviewed: calcium supplements, depression evaluation, low fat, low cholesterol diet, safe sex/STD prevention, self breast exams, skin cancer screening and weight bearing exercise. Contraception: abstinence, IUD and IUD removal today. Follow up in: 1 year.   No orders of the defined types were placed in this encounter.   Orders Placed This Encounter  Procedures  . US BREAST LTD UNI LEFT INC AXILLA    Standing Status: Future     Number of Occurrences:      Standing Expiration Date: 02/11/2017    Order Specific Question:  Reason for Exam (SYMPTOM  OR DIAGNOSIS REQUIRED)    Answer:  dense breast tissue, small masses noted under nipple around 3 o'clock region    Order Specific Question:  Preferred imaging location?    Answer:  [facility]  . US BREAST LTD UNI RIGHT INC AXILLA    Standing Status: Future     Number of Occurrences:      Standing Expiration Date: 02/11/2017    Order Specific Question:  Reason for Exam (SYMPTOM  OR DIAGNOSIS REQUIRED)    Answer:  dense breast tissue, mass under nipple 2-3 cm in size, mobile    Order Specific Question:  Preferred imaging location?    Answer:  [facility]  . Hepatitis B surface antigen  . RPR  . Hepatitis C antibody  . HIV antibody

## 2015-12-13 LAB — RPR: RPR Ser Ql: NONREACTIVE

## 2015-12-13 LAB — HIV ANTIBODY (ROUTINE TESTING W REFLEX): HIV Screen 4th Generation wRfx: NONREACTIVE

## 2015-12-13 LAB — HEPATITIS C ANTIBODY: Hep C Virus Ab: <0.1 s/co ratio (ref 0.0–0.9)

## 2015-12-13 LAB — HEPATITIS B SURFACE ANTIGEN: HEP B S AG: NEGATIVE

## 2015-12-14 LAB — PAP IG W/ RFLX HPV ASCU: PAP Smear Comment: 0

## 2016-01-11 ENCOUNTER — Other Ambulatory Visit: Payer: Self-pay

## 2016-01-11 ENCOUNTER — Inpatient Hospital Stay: Admission: RE | Admit: 2016-01-11 | Payer: Self-pay | Source: Ambulatory Visit

## 2016-01-18 ENCOUNTER — Other Ambulatory Visit: Payer: Self-pay | Admitting: Certified Nurse Midwife

## 2016-01-28 ENCOUNTER — Other Ambulatory Visit: Payer: Self-pay

## 2016-01-30 ENCOUNTER — Other Ambulatory Visit: Payer: Self-pay

## 2016-02-13 ENCOUNTER — Other Ambulatory Visit: Payer: Self-pay

## 2016-02-13 ENCOUNTER — Inpatient Hospital Stay: Admission: RE | Admit: 2016-02-13 | Payer: Self-pay | Source: Ambulatory Visit

## 2016-03-26 ENCOUNTER — Ambulatory Visit
Admission: RE | Admit: 2016-03-26 | Discharge: 2016-03-26 | Disposition: A | Discharge status: Home / Self Care | Payer: Medicaid Other | Source: Ambulatory Visit | Attending: Certified Nurse Midwife | Admitting: Certified Nurse Midwife

## 2016-03-26 DIAGNOSIS — R922 Inconclusive mammogram: Secondary | ICD-10-CM

## 2016-03-26 DIAGNOSIS — N6310 Unspecified lump in the right breast, unspecified quadrant: Secondary | ICD-10-CM

## 2016-12-12 ENCOUNTER — Ambulatory Visit: Payer: Self-pay | Admitting: Certified Nurse Midwife

## 2016-12-29 ENCOUNTER — Encounter (HOSPITAL_COMMUNITY): Payer: Self-pay | Admitting: Emergency Medicine

## 2016-12-29 ENCOUNTER — Ambulatory Visit (HOSPITAL_COMMUNITY)
Admission: EM | Admit: 2016-12-29 | Discharge: 2016-12-29 | Disposition: A | Discharge status: Home / Self Care | Payer: Medicaid Other | Attending: Family Medicine | Admitting: Family Medicine

## 2016-12-29 DIAGNOSIS — S29012A Strain of muscle and tendon of back wall of thorax, initial encounter: Secondary | ICD-10-CM | POA: Diagnosis not present

## 2016-12-29 DIAGNOSIS — W19XXXA Unspecified fall, initial encounter: Secondary | ICD-10-CM

## 2016-12-29 DIAGNOSIS — W108XXA Fall (on) (from) other stairs and steps, initial encounter: Secondary | ICD-10-CM

## 2016-12-29 DIAGNOSIS — M5489 Other dorsalgia: Secondary | ICD-10-CM | POA: Diagnosis not present

## 2016-12-29 MED ORDER — NAPROXEN 500 MG PO TABS: 500.0000 mg | ORAL_TABLET | Freq: Two times a day (BID) | ORAL | 0 refills | Status: AC

## 2016-12-29 MED ORDER — KETOROLAC TROMETHAMINE 60 MG/2ML IM SOLN
60.0000 mg | Freq: Once | INTRAMUSCULAR | Status: AC
  Administered 2016-12-29: 60 mg via INTRAMUSCULAR

## 2016-12-29 MED ORDER — KETOROLAC TROMETHAMINE 60 MG/2ML IM SOLN
INTRAMUSCULAR | Status: AC
  Filled 2016-12-29: qty 2

## 2016-12-29 NOTE — ED Triage Notes (Signed)
Slipped in the rain last night, falling on wood.  Upper back pain on right side of back.  Minimal shoulder pain.

## 2016-12-29 NOTE — ED Provider Notes (Signed)
CSN: [id number]     Arrival date & time 12/29/16  1258 History   None    Chief Complaint  Patient presents with  . Fall   (Consider location/radiation/quality/duration/timing/severity/associated sxs/prior Treatment) 26 year old female comes in for right upper back pain after slipping and falling on the stairs yesterday. Denies head injury, loss of consciousness. States she felt fine after the fall, but woke up this morning with soreness. Denies numbness, tingling. Denies trouble breathing, and shortness of breath, wheezing. She has not taken anything for it. No trouble moving her back. She works at [employer] and is required to carry heavy boxes.       Past Medical History:  Diagnosis Date  . Allergy    SEASONAL   Past Surgical History:  Procedure Laterality Date  . CESAREAN SECTION     No family history on file. Social History  Substance Use Topics  . Smoking status: Current Every Day Smoker    Packs/day: 1.00    Types: Cigarettes  . Smokeless tobacco: Not on file  . Alcohol use 0.0 oz/week     Comment: no alcoholsince last appointment   OB History    Gravida Para Term Preterm AB Living   1 1 1     1    SAB TAB Ectopic Multiple Live Births                 Review of Systems  Reason unable to perform ROS: See HPI as above.    Allergies  Other  Home Medications   Prior to Admission medications   Medication Sig Start Date End Date Taking? Authorizing Provider  naproxen (NAPROSYN) 500 MG tablet Take 1 tablet (500 mg total) by mouth 2 (two) times daily. 12/29/16 01/08/17  Belinda FisherYu, Markell Schrier V, PA-C   Meds Ordered and Administered this Visit   Medications  ketorolac (TORADOL) injection 60 mg (not administered)    BP 98/69 (BP Location: Left Arm)   Pulse 70   Temp 98.6 F (37 C) (Oral)   Resp 20   LMP 12/18/2016   SpO2 100%  No data found.   Physical Exam  Constitutional: She appears well-developed and well-nourished. No distress.  HENT:  Head: Normocephalic and  atraumatic.  Neck: Normal range of motion. Neck supple. Muscular tenderness (right side) present. No spinous process tenderness present. Normal range of motion present.  Cardiovascular: Normal rate, regular rhythm and normal heart sounds.  Exam reveals no gallop and no friction rub.   No murmur heard. Pulmonary/Chest: Effort normal and breath sounds normal. No respiratory distress. She has no wheezes. She has no rales.  Musculoskeletal:  No midline tenderness. Tenderness to palpation of the right upper back, next to the scapula. Full range of motion.    Urgent Care Course     Procedures (including critical care time)  Labs Review Labs Reviewed - No data to display  Imaging Review No results found.       MDM   1. Muscle strain of right upper back, initial encounter   2. Fall, initial encounter    Discussed with patient history and exam most consistent with muscle strain, start naproxen 500 mg twice a day for 10 days. Ice compresses needed. Given patient's occupation, Toradol IM was offered, which patient accepted. Patient to start Naproxen 6 hours after this visit. Monitor for worsening of symptoms, follow up as needed for reevaluation.    Belinda FisherYu, Rukiya Hodgkins V, PA-C 12/29/16 1505

## 2016-12-29 NOTE — Discharge Instructions (Signed)
You were given a Toradol shot today to help with the pain and inflammation. Start naproxen 6 hours after visit, 500 mg every 12 hours for 10 days. Ice compress for pain and swelling. This can take up to 2-3 weeks to completely resolve, but you should be feeling better each week. Follow up with PCP for further evaluation needed.

## 2020-09-26 ENCOUNTER — Other Ambulatory Visit: Payer: Self-pay

## 2020-09-26 ENCOUNTER — Ambulatory Visit (HOSPITAL_COMMUNITY)
Admission: EM | Admit: 2020-09-26 | Discharge: 2020-09-26 | Disposition: A | Discharge status: Home / Self Care | Payer: Self-pay

## 2020-09-26 NOTE — ED Notes (Signed)
Pt not in waiting room x 1 

## 2021-02-08 ENCOUNTER — Encounter (HOSPITAL_COMMUNITY): Payer: Self-pay

## 2021-02-08 ENCOUNTER — Emergency Department (HOSPITAL_COMMUNITY)
Admission: EM | Admit: 2021-02-08 | Discharge: 2021-02-08 | Disposition: A | Discharge status: Home / Self Care | Payer: Medicaid Other | Attending: Emergency Medicine | Admitting: Emergency Medicine

## 2021-02-08 DIAGNOSIS — M546 Pain in thoracic spine: Secondary | ICD-10-CM | POA: Insufficient documentation

## 2021-02-08 DIAGNOSIS — F17210 Nicotine dependence, cigarettes, uncomplicated: Secondary | ICD-10-CM | POA: Insufficient documentation

## 2021-02-08 DIAGNOSIS — T148XXA Other injury of unspecified body region, initial encounter: Secondary | ICD-10-CM

## 2021-02-08 DIAGNOSIS — S060X0A Concussion without loss of consciousness, initial encounter: Secondary | ICD-10-CM | POA: Insufficient documentation

## 2021-02-08 DIAGNOSIS — Y92410 Unspecified street and highway as the place of occurrence of the external cause: Secondary | ICD-10-CM | POA: Insufficient documentation

## 2021-02-08 DIAGNOSIS — S161XXA Strain of muscle, fascia and tendon at neck level, initial encounter: Secondary | ICD-10-CM | POA: Insufficient documentation

## 2021-02-08 DIAGNOSIS — M25511 Pain in right shoulder: Secondary | ICD-10-CM | POA: Insufficient documentation

## 2021-02-08 DIAGNOSIS — R519 Headache, unspecified: Secondary | ICD-10-CM | POA: Insufficient documentation

## 2021-02-08 MED ORDER — CYCLOBENZAPRINE HCL 10 MG PO TABS: 10.0000 mg | ORAL_TABLET | Freq: Two times a day (BID) | ORAL | 0 refills | Status: DC | PRN

## 2021-02-08 MED ORDER — NAPROXEN 500 MG PO TABS: 500.0000 mg | ORAL_TABLET | Freq: Two times a day (BID) | ORAL | 0 refills | Status: DC

## 2021-02-08 NOTE — Discharge Instructions (Signed)
You can use the medications to help with pain and muscle spasm.  However also try muscle rubs, heating pad or deep tissue massage.
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
## 2022-03-04 ENCOUNTER — Ambulatory Visit (INDEPENDENT_AMBULATORY_CARE_PROVIDER_SITE_OTHER): Payer: Self-pay | Admitting: General Practice

## 2022-03-04 ENCOUNTER — Other Ambulatory Visit (HOSPITAL_COMMUNITY)
Admission: RE | Admit: 2022-03-04 | Discharge: 2022-03-04 | Disposition: A | Discharge status: Home / Self Care | Payer: Medicaid Other | Source: Ambulatory Visit | Attending: Family Medicine | Admitting: Family Medicine

## 2022-03-04 VITALS — BP 113/75 | HR 76 | Ht 61.0 in | Wt 121.0 lb

## 2022-03-04 DIAGNOSIS — Z113 Encounter for screening for infections with a predominantly sexual mode of transmission: Secondary | ICD-10-CM | POA: Diagnosis present

## 2022-03-04 DIAGNOSIS — N898 Other specified noninflammatory disorders of vagina: Secondary | ICD-10-CM

## 2022-03-04 NOTE — Progress Notes (Signed)
SUBJECTIVE:  31 y.o. female presents for STD testing.  Denies abnormal vaginal bleeding or significant pelvic pain or fever. No UTI symptoms. Denies history of known exposure to STD.  No LMP recorded.  OBJECTIVE:  She appears well, afebrile. Urine dipstick: not done.  ASSESSMENT:  Vaginal Discharge  Vaginal Odor   PLAN:  GC, chlamydia, trichomonas, BVAG, CVAG probe sent to lab. Treatment: To be determined once lab results are received ROV prn if symptoms persist or worsen.

## 2022-03-05 LAB — CERVICOVAGINAL ANCILLARY ONLY
Bacterial Vaginitis (gardnerella): NEGATIVE
Candida Glabrata: NEGATIVE
Candida Vaginitis: NEGATIVE
Chlamydia: NEGATIVE
Comment: NEGATIVE
Comment: NEGATIVE
Comment: NEGATIVE
Comment: NEGATIVE
Comment: NEGATIVE
Comment: NORMAL
Neisseria Gonorrhea: NEGATIVE
Trichomonas: NEGATIVE

## 2022-03-05 LAB — HEPATITIS B SURFACE ANTIGEN: Hepatitis B Surface Ag: NEGATIVE

## 2022-03-05 LAB — HIV ANTIBODY (ROUTINE TESTING W REFLEX): HIV Screen 4th Generation wRfx: NONREACTIVE

## 2022-03-05 LAB — HEPATITIS C ANTIBODY: Hep C Virus Ab: NONREACTIVE

## 2022-03-05 LAB — RPR: RPR Ser Ql: NONREACTIVE

## 2023-01-27 ENCOUNTER — Encounter: Payer: Self-pay | Admitting: Obstetrics and Gynecology

## 2023-01-27 ENCOUNTER — Other Ambulatory Visit (HOSPITAL_COMMUNITY)
Admission: RE | Admit: 2023-01-27 | Discharge: 2023-01-27 | Disposition: A | Discharge status: Home / Self Care | Payer: Medicaid Other | Source: Ambulatory Visit | Attending: Obstetrics and Gynecology | Admitting: Obstetrics and Gynecology

## 2023-01-27 ENCOUNTER — Ambulatory Visit (INDEPENDENT_AMBULATORY_CARE_PROVIDER_SITE_OTHER): Payer: Medicaid Other | Admitting: Obstetrics and Gynecology

## 2023-01-27 VITALS — BP 113/79 | HR 90 | Ht 61.0 in | Wt 127.0 lb

## 2023-01-27 DIAGNOSIS — Z01419 Encounter for gynecological examination (general) (routine) without abnormal findings: Secondary | ICD-10-CM

## 2023-01-27 NOTE — Progress Notes (Signed)
ANNUAL EXAM Patient name: Jennifer Roy MRN [id number]  Date of birth: June 05, 1990 Chief Complaint:   Gynecologic Exam  History of Present Illness:   Hananiah Kaminskas is a 32 y.o. G1P1001 with Patient's last menstrual period was 01/11/2023 (exact date). being seen today for a routine annual exam.  Current complaints: None Regular cycles   Upstream - 01/27/23 1504       Pregnancy Intention Screening   Does the patient want to become pregnant in the next year? No    Does the patient's partner want to become pregnant in the next year? No    Would the patient like to discuss contraceptive options today? Yes      Contraception Wrap Up   Current Method Female Condom    End Method Female Condom    Contraception Counseling Provided Yes    How was the end contraceptive method provided? N/A            The pregnancy intention screening data noted above was reviewed. Potential methods of contraception were discussed. The patient elected to proceed with Female Condom.   Last pap 12/12/15. Results were: NILM w/ HRHPV not done. H/O abnormal pap: no Last mammogram: n/a. Results were: N/A. Family h/o breast cancer: no Last colonoscopy: n/a. Results were: N/A. Family h/o colorectal cancer: no HPV vaccine: unsure     01/27/2023    3:05 PM  Depression screen PHQ 2/9  Decreased Interest 0  Down, Depressed, Hopeless 0  PHQ - 2 Score 0  Altered sleeping 0  Tired, decreased energy 0  Change in appetite 0  Feeling bad or failure about yourself  0  Trouble concentrating 0  Moving slowly or fidgety/restless 0  Suicidal thoughts 0  PHQ-9 Score 0  Difficult doing work/chores Not difficult at all        01/27/2023    3:06 PM  GAD 7 : Generalized Anxiety Score  Nervous, Anxious, on Edge 0  Control/stop worrying 0  Worry too much - different things 0  Trouble relaxing 0  Restless 0  Easily annoyed or irritable 1  Afraid - awful might happen 0  Total GAD 7 Score 1  Anxiety Difficulty Not  difficult at all     Review of Systems:   Pertinent items are noted in HPI Denies any headaches, blurred vision, fatigue, shortness of breath, chest pain, abdominal pain, abnormal vaginal discharge/itching/odor/irritation, problems with periods, bowel movements, urination, or intercourse unless otherwise stated above. Pertinent History Reviewed:  Reviewed past medical,surgical, social and family history.  Reviewed problem list, medications and allergies. Physical Assessment:   Vitals:   01/27/23 1501  BP: 113/79  Pulse: 90  Weight: 127 lb (57.6 kg)  Height: 5\' 1"  (1.549 m)  Body mass index is 24 kg/m.        Physical Examination:   General appearance - well appearing, and in no distress  Mental status - alert, oriented to person, place, and time  Chest - respiratory effort normal  Heart - normal peripheral perfusion  Breasts - breasts appear normal, no suspicious masses, no skin or nipple changes or axillary nodes  Abdomen - soft, nontender, nondistended, no masses or organomegaly  Pelvic - VULVA: normal appearing vulva with no masses, tenderness or lesions  VAGINA: normal appearing vagina with normal color and discharge, no lesions  CERVIX: normal appearing cervix without discharge or lesions, no CMT  Thin prep pap is done with HR HPV cotesting  UTERUS: uterus is felt to be normal size,  shape, consistency and nontender   ADNEXA: No adnexal masses or tenderness noted.  Chaperone present for exam  No results found for this or any previous visit (from the past 24 hour(s)).  Assessment & Plan:  1) Well-Woman Exam Mammogram: @ 32yo, or sooner if problems Colonoscopy: @ 32yo, or sooner if problems Pap: Collected Gardasil: Information given GC/CT: Collected HIV/HCV: Collected Continue condoms for contraception. She is not currently regularly sexually active, so she will let us know if she desires anything else in the future.   Labs/procedures today:   Orders Placed This  Encounter  Procedures   Hepatitis C Antibody   Hepatitis B Surface AntiGEN   RPR   HIV antibody (with reflex)   Meds: No orders of the defined types were placed in this encounter.  Follow-up: Return in about 1 year (around 01/27/2024) for annual exam or sooner as needed.  Lennart Pall, MD 01/27/2023 9:12 PM

## 2023-01-27 NOTE — Progress Notes (Signed)
32 y.o. GYN presents for AEX/PAP/STD screening.

## 2023-01-28 LAB — CERVICOVAGINAL ANCILLARY ONLY
Chlamydia: POSITIVE — AB
Comment: NEGATIVE
Comment: NEGATIVE
Comment: NORMAL
Neisseria Gonorrhea: NEGATIVE
Trichomonas: NEGATIVE

## 2023-01-28 LAB — HEPATITIS C ANTIBODY: Hep C Virus Ab: NONREACTIVE

## 2023-01-28 LAB — RPR: RPR Ser Ql: NONREACTIVE

## 2023-01-28 LAB — HIV ANTIBODY (ROUTINE TESTING W REFLEX): HIV Screen 4th Generation wRfx: NONREACTIVE

## 2023-01-28 LAB — HEPATITIS B SURFACE ANTIGEN: Hepatitis B Surface Ag: NEGATIVE

## 2023-01-29 MED ORDER — DOXYCYCLINE HYCLATE 100 MG PO CAPS: 100.0000 mg | ORAL_CAPSULE | Freq: Two times a day (BID) | ORAL | 0 refills | Status: AC

## 2023-01-29 NOTE — Addendum Note (Signed)
Addended by: Harvie Bridge on: 01/29/2023 11:45 AM   Modules accepted: Orders

## 2023-02-05 LAB — CYTOLOGY - PAP
Comment: NEGATIVE
Diagnosis: NEGATIVE
Diagnosis: REACTIVE
High risk HPV: NEGATIVE

## 2023-07-18 ENCOUNTER — Encounter (HOSPITAL_COMMUNITY): Payer: Self-pay

## 2023-07-18 ENCOUNTER — Ambulatory Visit (HOSPITAL_COMMUNITY)
Admission: EM | Admit: 2023-07-18 | Discharge: 2023-07-18 | Disposition: A | Discharge status: Home / Self Care | Payer: Medicaid Other | Attending: Internal Medicine | Admitting: Internal Medicine

## 2023-07-18 DIAGNOSIS — S20219A Contusion of unspecified front wall of thorax, initial encounter: Secondary | ICD-10-CM | POA: Diagnosis not present

## 2023-07-18 MED ORDER — KETOROLAC TROMETHAMINE 30 MG/ML IJ SOLN
INTRAMUSCULAR | Status: AC
  Filled 2023-07-18: qty 1

## 2023-07-18 MED ORDER — CYCLOBENZAPRINE HCL 10 MG PO TABS: 10.0000 mg | ORAL_TABLET | Freq: Three times a day (TID) | ORAL | 0 refills | Status: DC | PRN

## 2023-07-18 MED ORDER — PREDNISONE 20 MG PO TABS: 40.0000 mg | ORAL_TABLET | Freq: Every day | ORAL | 0 refills | Status: AC

## 2023-07-18 MED ORDER — KETOROLAC TROMETHAMINE 30 MG/ML IJ SOLN
30.0000 mg | Freq: Once | INTRAMUSCULAR | Status: AC
  Administered 2023-07-18: 30 mg via INTRAMUSCULAR

## 2023-07-18 MED ORDER — DEXAMETHASONE SODIUM PHOSPHATE 10 MG/ML IJ SOLN
10.0000 mg | Freq: Once | INTRAMUSCULAR | Status: AC
  Administered 2023-07-18: 10 mg via INTRAMUSCULAR

## 2023-07-18 MED ORDER — DEXAMETHASONE SODIUM PHOSPHATE 10 MG/ML IJ SOLN
INTRAMUSCULAR | Status: AC
  Filled 2023-07-18: qty 1
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
## 2023-09-08 ENCOUNTER — Other Ambulatory Visit (HOSPITAL_COMMUNITY)
Admission: RE | Admit: 2023-09-08 | Discharge: 2023-09-08 | Disposition: A | Discharge status: Home / Self Care | Source: Ambulatory Visit | Attending: Advanced Practice Midwife | Admitting: Advanced Practice Midwife

## 2023-09-08 ENCOUNTER — Ambulatory Visit: Admitting: *Deleted

## 2023-09-08 VITALS — BP 110/75 | HR 70

## 2023-09-08 DIAGNOSIS — Z113 Encounter for screening for infections with a predominantly sexual mode of transmission: Secondary | ICD-10-CM | POA: Insufficient documentation

## 2023-09-08 NOTE — Progress Notes (Signed)
 SUBJECTIVE:  33 y.o. female who desires a STI screen. Denies abnormal vaginal discharge, bleeding or significant pelvic pain. No UTI symptoms. Denies history of known exposure to STD.  No LMP recorded.  OBJECTIVE:  She appears well.   ASSESSMENT:  STI Screen   PLAN:  Pt offered STI blood screening-requested GC, chlamydia, and trichomonas probe sent to lab.  Treatment: To be determined once lab results are received.  Pt follow up as needed.  BP 110 75, P 70

## 2023-09-09 ENCOUNTER — Encounter: Payer: Self-pay | Admitting: Obstetrics and Gynecology

## 2023-09-09 ENCOUNTER — Other Ambulatory Visit: Payer: Self-pay

## 2023-09-09 LAB — CERVICOVAGINAL ANCILLARY ONLY
Bacterial Vaginitis (gardnerella): POSITIVE — AB
Candida Glabrata: NEGATIVE
Candida Vaginitis: NEGATIVE
Chlamydia: NEGATIVE
Comment: NEGATIVE
Comment: NEGATIVE
Comment: NEGATIVE
Comment: NEGATIVE
Comment: NEGATIVE
Comment: NORMAL
Neisseria Gonorrhea: NEGATIVE
Trichomonas: NEGATIVE

## 2023-09-09 LAB — HEPATITIS B SURFACE ANTIGEN: Hepatitis B Surface Ag: NEGATIVE

## 2023-09-09 LAB — HIV ANTIBODY (ROUTINE TESTING W REFLEX): HIV Screen 4th Generation wRfx: NONREACTIVE

## 2023-09-09 LAB — RPR: RPR Ser Ql: NONREACTIVE

## 2023-09-09 LAB — HEPATITIS C ANTIBODY: Hep C Virus Ab: NONREACTIVE

## 2023-09-09 MED ORDER — METRONIDAZOLE 500 MG PO TABS: 500.0000 mg | ORAL_TABLET | Freq: Two times a day (BID) | ORAL | 0 refills | Status: DC

## 2023-09-09 NOTE — Progress Notes (Signed)
 Flagyl rx sent per protocol for BV

## 2023-09-25 ENCOUNTER — Telehealth: Admitting: Physician Assistant

## 2023-09-25 DIAGNOSIS — B3731 Acute candidiasis of vulva and vagina: Secondary | ICD-10-CM

## 2023-09-25 MED ORDER — FLUCONAZOLE 150 MG PO TABS: 150.0000 mg | ORAL_TABLET | Freq: Once | ORAL | 0 refills | Status: AC

## 2023-09-25 NOTE — Progress Notes (Signed)

## 2023-09-28 ENCOUNTER — Telehealth: Admitting: Physician Assistant

## 2023-09-28 DIAGNOSIS — B9689 Other specified bacterial agents as the cause of diseases classified elsewhere: Secondary | ICD-10-CM

## 2023-09-28 MED ORDER — METRONIDAZOLE 500 MG PO TABS: 500.0000 mg | ORAL_TABLET | Freq: Two times a day (BID) | ORAL | 0 refills | Status: AC

## 2023-09-28 NOTE — Progress Notes (Signed)

## 2024-03-29 ENCOUNTER — Ambulatory Visit: Admitting: Obstetrics and Gynecology

## 2024-04-18 ENCOUNTER — Ambulatory Visit: Admitting: Obstetrics and Gynecology

## 2024-04-18 ENCOUNTER — Other Ambulatory Visit (HOSPITAL_COMMUNITY)
Admission: RE | Admit: 2024-04-18 | Discharge: 2024-04-18 | Disposition: A | Source: Ambulatory Visit | Attending: Obstetrics and Gynecology | Admitting: Obstetrics and Gynecology

## 2024-04-18 ENCOUNTER — Encounter: Payer: Self-pay | Admitting: Obstetrics and Gynecology

## 2024-04-18 VITALS — BP 101/67 | HR 94 | Ht 61.0 in | Wt 122.4 lb

## 2024-04-18 DIAGNOSIS — Z01419 Encounter for gynecological examination (general) (routine) without abnormal findings: Secondary | ICD-10-CM | POA: Diagnosis present

## 2024-04-18 DIAGNOSIS — Z113 Encounter for screening for infections with a predominantly sexual mode of transmission: Secondary | ICD-10-CM | POA: Diagnosis present

## 2024-04-18 DIAGNOSIS — Z3201 Encounter for pregnancy test, result positive: Secondary | ICD-10-CM | POA: Insufficient documentation

## 2024-04-18 MED ORDER — VITAFOL GUMMIES 3.33-0.333-34.8 MG PO CHEW: 3.0000 | CHEWABLE_TABLET | Freq: Every day | ORAL | 5 refills | Status: AC

## 2024-04-18 NOTE — Patient Instructions (Signed)

## 2024-04-18 NOTE — Progress Notes (Signed)
 Pt presents for AEX.  Last PAP 01/2024 Requesting PAP and STD testing   UPT positive x3 at home

## 2024-04-18 NOTE — Progress Notes (Addendum)
   ANNUAL EXAM Patient name: Jennifer Roy MRN [id number]  Date of birth: 04-12-1991 Chief Complaint:   Annual, positive home UPT  History of Present Illness:   Jennifer Roy is a 33 y.o. G6P1001  female being seen today for a routine annual exam.  Current complaints: positive home UPT x 2, her LMP was 03/13/24. Regular monthly periods, endorse mildly tender breast  Patient's last menstrual period was 03/13/2024.   Gynecologic History Patient's last menstrual period was . Contraception:  Last Pap: 8/272024. Results were: NILM, neg HPV  Last mammogram: n/a HPV vaccine:    Review of Systems:   Pertinent items are noted in HPI Denies any headaches, blurred vision, fatigue, shortness of breath, chest pain, abdominal pain, abnormal vaginal discharge/itching/odor/irritation, problems with periods, bowel movements, urination, or intercourse unless otherwise stated above. Pertinent History Reviewed:  Reviewed past medical,surgical, social and family history.  Reviewed problem list, medications and allergies. Physical Assessment:   Vitals:   04/18/24 1359  BP: 101/67  Pulse: 94  Weight: 122 lb 6.4 oz (55.5 kg)  Height: 5' 1 (1.549 m)  Body mass index is 23.13 kg/m.        Physical Examination:   General appearance - well appearing, and in no distress  Mental status - alert, oriented   Psych:  She has a normal mood and affect  Skin - warm and dry, normal color  Chest - effort normal, all lung fields clear to auscultation bilaterally  Heart - normal rate and regular rhythm  Neck:  midline trachea, no thyromegaly or nodules  Breasts - breasts appear normal, no suspicious masses, no skin or nipple changes or  axillary nodes  Abdomen - soft, nontender, nondistended  Pelvic - VULVA: normal appearing vulva with no masses, tenderness or lesions  VAGINA: normal appearing vagina with normal color and discharge, no lesions  CERVIX: normal appearing cervix without discharge or lesions,  no CMT  UTERUS: uterus is felt to be normal size, shape, consistency and nontender   ADNEXA: No adnexal masses or tenderness noted.  Extremities:  No swelling or varicosities noted  Chaperone present for exam  No results found for this or any previous visit (from the past 24 hours).  Assessment & Plan:  1. Encounter for annual routine gynecological examination (Primary) UTD on pap, discussed ASCCP guidelines, desires pap today Encouaged self breast exam, mammogram at 40  - Cytology - PAP( [place])  3. Positive pregnancy test Positive in clinic  Encouraged PNV, rx sent to pharmacy Encouraged vaping cessation  Sure LMP, will return for NOB intake  MAU precautions discussed Hx of cesarean section for NRFHR 33 y.o., will review at upcoming visits  - Prenatal Vit-Fe Phos-FA-Omega (VITAFOL GUMMIES) 3.33-0.333-34.8 MG CHEW; Chew 3 tablets by mouth daily.  Dispense: 90 tablet; Refill: 5  3. Screen for STD (sexually transmitted disease)  - Cervicovaginal ancillary only( [place]) - HIV antibody (with reflex) - RPR - Hepatitis C Antibody - Hepatitis B Surface AntiGEN   Labs/procedures today:   Mammogram: @ 33yo, or sooner if problems Colonoscopy: @ 33yo, or sooner if problems  No orders of the defined types were placed in this encounter.   Meds: No orders of the defined types were placed in this encounter.   Follow-up: Return New ob in take in 4 weeks.  Nidia Daring, FNP 04/18/2024 2:15 PM

## 2024-04-19 LAB — CERVICOVAGINAL ANCILLARY ONLY
Chlamydia: NEGATIVE
Comment: NEGATIVE
Comment: NEGATIVE
Comment: NORMAL
Neisseria Gonorrhea: NEGATIVE
Trichomonas: POSITIVE — AB

## 2024-04-20 ENCOUNTER — Ambulatory Visit: Payer: Self-pay | Admitting: Obstetrics and Gynecology

## 2024-04-20 DIAGNOSIS — A5901 Trichomonal vulvovaginitis: Secondary | ICD-10-CM | POA: Insufficient documentation

## 2024-04-20 MED ORDER — METRONIDAZOLE 500 MG PO TABS: 500.0000 mg | ORAL_TABLET | Freq: Two times a day (BID) | ORAL | 0 refills | Status: AC

## 2024-04-21 LAB — CYTOLOGY - PAP
Adequacy: ABSENT
Comment: NEGATIVE
Diagnosis: NEGATIVE
High risk HPV: NEGATIVE

## 2024-04-23 ENCOUNTER — Other Ambulatory Visit: Payer: Self-pay

## 2024-04-23 ENCOUNTER — Inpatient Hospital Stay (HOSPITAL_COMMUNITY)
Admission: AD | Admit: 2024-04-23 | Discharge: 2024-04-23 | Disposition: A | Discharge status: Home / Self Care | Attending: Obstetrics & Gynecology | Admitting: Obstetrics & Gynecology

## 2024-04-23 ENCOUNTER — Inpatient Hospital Stay (HOSPITAL_COMMUNITY)

## 2024-04-23 ENCOUNTER — Encounter: Payer: Self-pay | Admitting: Family Medicine

## 2024-04-23 DIAGNOSIS — O4691 Antepartum hemorrhage, unspecified, first trimester: Secondary | ICD-10-CM | POA: Insufficient documentation

## 2024-04-23 DIAGNOSIS — O23591 Infection of other part of genital tract in pregnancy, first trimester: Secondary | ICD-10-CM | POA: Insufficient documentation

## 2024-04-23 DIAGNOSIS — O209 Hemorrhage in early pregnancy, unspecified: Secondary | ICD-10-CM

## 2024-04-23 DIAGNOSIS — D259 Leiomyoma of uterus, unspecified: Secondary | ICD-10-CM | POA: Insufficient documentation

## 2024-04-23 DIAGNOSIS — Z3A01 Less than 8 weeks gestation of pregnancy: Secondary | ICD-10-CM | POA: Insufficient documentation

## 2024-04-23 DIAGNOSIS — O3680X0 Pregnancy with inconclusive fetal viability, not applicable or unspecified: Secondary | ICD-10-CM | POA: Insufficient documentation

## 2024-04-23 DIAGNOSIS — B9689 Other specified bacterial agents as the cause of diseases classified elsewhere: Secondary | ICD-10-CM | POA: Insufficient documentation

## 2024-04-23 LAB — CBC
HCT: 33.6 % — ABNORMAL LOW (ref 36.0–46.0)
Hemoglobin: 11.3 g/dL — ABNORMAL LOW (ref 12.0–15.0)
MCH: 31 pg (ref 26.0–34.0)
MCHC: 33.6 g/dL (ref 30.0–36.0)
MCV: 92.1 fL (ref 80.0–100.0)
Platelets: 311 K/uL (ref 150–400)
RBC: 3.65 MIL/uL — ABNORMAL LOW (ref 3.87–5.11)
RDW: 13.5 % (ref 11.5–15.5)
WBC: 11.4 K/uL — ABNORMAL HIGH (ref 4.0–10.5)
nRBC: 0 % (ref 0.0–0.2)

## 2024-04-23 LAB — URINALYSIS, ROUTINE W REFLEX MICROSCOPIC
Bilirubin Urine: NEGATIVE
Glucose, UA: NEGATIVE mg/dL
Ketones, ur: NEGATIVE mg/dL
Nitrite: NEGATIVE
Protein, ur: NEGATIVE mg/dL
RBC / HPF: >50 RBC/hpf (ref 0–5)
Specific Gravity, Urine: 1.02 (ref 1.005–1.030)
pH: 8 (ref 5.0–8.0)

## 2024-04-23 LAB — ABO/RH: ABO/RH(D): A POS

## 2024-04-23 LAB — HCG, QUANTITATIVE, PREGNANCY: hCG, Beta Chain, Quant, S: 623 m[IU]/mL — ABNORMAL HIGH (ref <5)

## 2024-04-23 NOTE — MAU Provider Note (Signed)
 Chief Complaint:  Abdominal Pain and Vaginal Bleeding   HPI   None     Jennifer Roy is a 33 y.o. G2P1001 at [redacted]w[redacted]d who presents to maternity admissions reporting vaginal bleeding. Used the bathroom around 8pm and noted pink discharge when wiping. Went to sleep and woke up about an hour ago, felt a lower abdominal pressure as if she needed to have a bowel movement and then noted bright red blood dripping into the toilet. Also noted spotting on her underwear which was concerning to her. Denies fever, chills, shortness of breath, chest pain. Having intermittent cramps at a 3/10 on the pain scale.  Has not has an ultrasound during this pregnancy. Last sexual intercourse about a month ago.  Pregnancy Course: Follows at [facility] for prenatal care. Is taking flagyl  for trichomonas infection.   Past Medical History:  Diagnosis Date   Allergy    SEASONAL   OB History  Gravida Para Term Preterm AB Living  2 1 1   1   SAB IAB Ectopic Multiple Live Births          # Outcome Date GA Lbr Len/2nd Weight Sex Type Anes PTL Lv  2 Current           1 Term      CS-Unspec      Past Surgical History:  Procedure Laterality Date   CESAREAN SECTION     No family history on file. Social History   Tobacco Use   Smoking status: Former    Current packs/day: 1.00    Types: Cigarettes   Smokeless tobacco: Never  Vaping Use   Vaping status: Every Day  Substance Use Topics   Alcohol use: Yes    Comment: socially   Drug use: No   Allergies  Allergen Reactions   Other     Seasonal Allergies    No medications prior to admission.    I have reviewed patient's Past Medical Hx, Surgical Hx, Family Hx, Social Hx, medications and allergies.   ROS  Pertinent items noted in HPI and remainder of comprehensive ROS otherwise negative.   PHYSICAL EXAM  Patient Vitals for the past 24 hrs:  BP Temp Temp src Pulse Resp Height Weight  04/23/24 0212 -- -- -- -- 12 -- --  04/23/24 0037 101/68 99.4 F (37.4  C) Oral (!) 108 12 5' 1 (1.549 m) 56.4 kg    Constitutional: Well-developed, well-nourished female in no acute distress.  Cardiovascular: Warm and well-perfused Respiratory: normal effort, no problems with respiration noted GI: Abd soft, non-tender, non-distended MS: Extremities nontender, no edema, normal ROM Neurologic: Alert and oriented x 4.  GU: no CVA tenderness Pelvic: deferred      Labs: Results for orders placed or performed during the hospital encounter of 04/23/24 (from the past 24 hours)  Urinalysis, Routine w reflex microscopic -Urine, Clean Catch     Status: Abnormal   Collection Time: 04/23/24 12:41 AM  Result Value Ref Range   Color, Urine YELLOW YELLOW   APPearance CLOUDY (A) CLEAR   Specific Gravity, Urine 1.020 1.005 - 1.030   pH 8.0 5.0 - 8.0   Glucose, UA NEGATIVE NEGATIVE mg/dL   Hgb urine dipstick SMALL (A) NEGATIVE   Bilirubin Urine NEGATIVE NEGATIVE   Ketones, ur NEGATIVE NEGATIVE mg/dL   Protein, ur NEGATIVE NEGATIVE mg/dL   Nitrite NEGATIVE NEGATIVE   Leukocytes,Ua TRACE (A) NEGATIVE   RBC / HPF >50 0 - 5 RBC/hpf   WBC, UA 0-5 0 -  5 WBC/hpf   Bacteria, UA RARE (A) NONE SEEN   Squamous Epithelial / HPF 0-5 0 - 5 /HPF   Mucus PRESENT    Amorphous Crystal PRESENT   CBC     Status: Abnormal   Collection Time: 04/23/24  1:43 AM  Result Value Ref Range   WBC 11.4 (H) 4.0 - 10.5 K/uL   RBC 3.65 (L) 3.87 - 5.11 MIL/uL   Hemoglobin 11.3 (L) 12.0 - 15.0 g/dL   HCT 66.3 (L) 63.9 - 53.9 %   MCV 92.1 80.0 - 100.0 fL   MCH 31.0 26.0 - 34.0 pg   MCHC 33.6 30.0 - 36.0 g/dL   RDW 86.4 88.4 - 84.4 %   Platelets 311 150 - 400 K/uL   nRBC 0.0 0.0 - 0.2 %    Imaging:  US  OB LESS THAN 14 WEEKS WITH OB TRANSVAGINAL Result Date: 04/23/2024 EXAM: OBSTETRIC ULTRASOUND FIRST TRIMESTER TECHNIQUE: Transvaginal first trimester obstetric pelvic duplex ultrasound was performed with real-time imaging, color flow Doppler imaging, and spectral analysis. COMPARISON:  None available. CLINICAL HISTORY: [redacted] weeks gestation of pregnancy; Pregnancy of unknown anatomic location. FINDINGS: UTERUS: Multiple uterine fibroids are seen. The largest of these lies on the right, measuring 4.5 cm. GESTATIONAL SAC(S): Single normal appearing intrauterine gestational sac is noted. MSD equals 2.8 mm, which corresponds to a gestational age of [redacted] weeks 0 days. No subchorionic hemorrhage is noted. YOLK SAC: Not identified. EMBRYO(<11WK) /FETUS(>=11WK): Not identified. RIGHT OVARY: The right ovary appears within normal limits. LEFT OVARY: The left ovary is not visualized due to overlying bowel gas. FREE FLUID: No free fluid. MEASUREMENTS ESTIMATED GESTATIONAL AGE BY CURRENT ULTRASOUND: 5 weeks 0 days ESTIMATED GESTATIONAL AGE BY LMP/PRIOR ULTRASOUND: 5 weeks 6 days IMPRESSION: 1. Intrauterine gestational sac corresponding to 5 weeks 0 days with no yolk sac or embryo identified, which may reflect early normal pregnancy; recommend short-interval follow-up ultrasound and/or serial beta-hCG as clinically warranted. 2. Multiple uterine fibroids, largest on the right measuring 4.5 cm. Electronically signed by: Oneil Devonshire MD 04/23/2024 01:25 AM EST RP Workstation: [id number]    MDM & MAU COURSE  MDM: Moderate  MAU Course: Orders Placed This Encounter  Procedures   US  OB LESS THAN 14 WEEKS WITH OB TRANSVAGINAL   Urinalysis, Routine w reflex microscopic -Urine, Clean Catch   CBC   hCG, quantitative, pregnancy   Diet NPO time specified   ABO/Rh   Discharge patient   No orders of the defined types were placed in this encounter.  VSS. Exam unremarkable. CUBA workup completed as no confirmed IUP, deferred swabs given recent completion on 11/19 with known trichomonas infection. UA and CBC unremarkable. US  showing IUGS with no yolk sac or embryo, discussed results with patient as this can be normal in early pregnancy. Plan for repeat ultrasound in 7-10 days, however best day for patient is December  4th due to work schedule. All questions answered prior to discharge.   ASSESSMENT   1. [redacted] weeks gestation of pregnancy     PLAN  Discharge home in stable condition with return precautions.  Follow up on December 4th at [facility] for viability ultrasound.   Follow up on December 15th for [facility] appointment with [facility].   Allergies as of 04/23/2024       Reactions   Other    Seasonal Allergies        Medication List     STOP taking these medications    cyclobenzaprine  10 MG tablet Commonly known as:  FLEXERIL        TAKE these medications    metroNIDAZOLE  500 MG tablet Commonly known as: FLAGYL  Take 1 tablet (500 mg total) by mouth 2 (two) times daily for 7 days.   Vitafol  Gummies 3.33-0.333-34.8 MG Chew Chew 3 tablets by mouth daily.        Charlie Courts, MD  Family Medicine - Obstetrics Fellow

## 2024-04-23 NOTE — MAU Provider Note (Signed)
 None     S Ms. Judah Chevere is a 33 y.o. G2P1001 patient who presents to MAU today with complaint of vaginal bleeding.  She was evaluated in the MAU at 1 AM today for the same.  Workup was significant for bacterial vaginosis with metronidazole  sent in lab and ultrasound showing IUGS and uterine fibroids.  She also has a known history of trichomonas infection.  She is back because she states that the vaginal bleeding has become slightly heavier.  O BP 100/69 (BP Location: Right Arm)   Pulse 93   Temp 99.3 F (37.4 C) (Oral)   Resp 18   LMP 03/13/2024   SpO2 100%  Physical Exam Vitals reviewed.  Constitutional:      General: She is not in acute distress.    Appearance: She is well-developed. She is not diaphoretic.  Eyes:     General: No scleral icterus. Pulmonary:     Effort: Pulmonary effort is normal. No respiratory distress.  Skin:    General: Skin is warm and dry.  Neurological:     Mental Status: She is alert.     Coordination: Coordination normal.     A Medical screening exam complete 1. Vaginal bleeding affecting early pregnancy (Primary) - Discharge patient  2. [redacted] weeks gestation of pregnancy - Discharge patient    P Discharge from MAU in stable condition with bleeding precautions Reviewed ultrasound results and discussed diagnosis of uterine fibroids, also provided written information in AVS and MyChart message for the patient.  We discussed that uterine fibroids can cause vaginal bleeding during pregnancy.  Vaginal bleeding can also be caused by bacterial vaginosis and vaginal ultrasound, vaginal exams, or vaginal intercourse.  Discussed that at this time, we cannot make a definitive diagnosis regarding the viability of this pregnancy and will need to repeat ultrasound as scheduled on December 4 in order to do so.  Continue to monitor symptoms and return to MAU if saturating a maxi pad in 30 minutes or less. Warning signs for worsening condition that would  warrant emergency follow-up discussed Patient may return to MAU as needed   Wallace Joesph LABOR, [place] 04/23/2024 2:28 PM

## 2024-04-23 NOTE — MAU Note (Signed)
 Pt says at 7pm- went to b-room- when she wiped - saw pink . Then at 2330- felt bloated- looked in toilet - red blood . Also feels cramps - started when she woke.

## 2024-04-23 NOTE — MAU Note (Signed)
 Jennifer Roy is a 33 y.o. at [redacted]w[redacted]d here in MAU reporting: was here early this morning and the bleeding has gotten heavier.    Pain score: 4/10 (like my period) Vitals:   04/23/24 1427  BP: 100/69  Pulse: 93  Resp: 18  Temp: 99.3 F (37.4 C)  SpO2: 100%

## 2024-04-23 NOTE — Discharge Instructions (Signed)
 I am so sorry that you are still experiencing bleeding, it can be very stressful.  Continue to monitor your symptoms at home, and take the antibiotic that Dr. Jomarie sent in for you.  It is possible that the vaginal bleeding is due to bacterial vaginosis, uterine fibroids, or early pregnancy loss.  We will not be able to make a definitive diagnosis until your ultrasound scheduled on December 4.  Reasons to return to MAU at [facility]: If you have heavier bleeding that soaks through more that 2 pads per hour for an hour or more If you bleed so much that you feel like you might pass out or you do pass out If you have significant abdominal pain that is not improved with Tylenol  1000 mg every 8 hours as needed for pain If you develop a fever > 100.5

## 2024-05-05 ENCOUNTER — Other Ambulatory Visit: Payer: Self-pay

## 2024-05-05 ENCOUNTER — Ambulatory Visit

## 2024-05-05 VITALS — BP 134/87 | HR 77 | Ht 61.0 in | Wt 124.0 lb

## 2024-05-05 DIAGNOSIS — D219 Benign neoplasm of connective and other soft tissue, unspecified: Secondary | ICD-10-CM

## 2024-05-05 DIAGNOSIS — O039 Complete or unspecified spontaneous abortion without complication: Secondary | ICD-10-CM

## 2024-05-05 DIAGNOSIS — O3680X0 Pregnancy with inconclusive fetal viability, not applicable or unspecified: Secondary | ICD-10-CM

## 2024-05-05 DIAGNOSIS — Z3A01 Less than 8 weeks gestation of pregnancy: Secondary | ICD-10-CM | POA: Diagnosis not present

## 2024-05-05 NOTE — Progress Notes (Unsigned)
   GYNECOLOGY PROGRESS NOTE  History:  33 y.o. G2P1001 presents to [facility] office today for pviability ultrasound. Was seen in the MAU on 11/22 for vaginal bleeding that started that morning. States that she continued to have bleeding for about 2 weeks. Noted some spotting with sexual intercourse yesterday and with the ultrasound, but otherwise bleeding has stopped.  She denies h/a, dizziness, shortness of breath, n/v, or fever/chills.    The following portions of the patient's history were reviewed and updated as appropriate: allergies, current medications, past family history, past medical history, past social history, past surgical history and problem list. Last pap smear on 04/18/24 was normal.  Health Maintenance Due  Topic Date Due   DTaP/Tdap/Td (1 - Tdap) Never done   Hepatitis B Vaccines 19-59 Average Risk (1 of 3 - 19+ 3-dose series) Never done   HPV VACCINES (1 - 3-dose SCDM series) Never done   Influenza Vaccine  Never done   COVID-19 Vaccine (1 - 2025-26 season) Never done     Review of Systems:  Pertinent items are noted in HPI.   Objective:  Physical Exam Blood pressure 134/87, pulse 77, height 5' 1 (1.549 m), weight 124 lb (56.2 kg), last menstrual period 03/13/2024, unknown if currently breastfeeding. VS reviewed, nursing note reviewed,  Constitutional: well developed, well nourished, no distress HEENT: normocephalic CV: normal rate Pulm/chest wall: normal effort Breast Exam: deferred Abdomen: soft Neuro: alert and oriented x 3 Skin: warm, dry Psych: affect normal Pelvic exam: deferred Bimanual exam: deferred  Assessment & Plan:  1. Miscarriage (Primary) Repeat ultrasound today showing no IUGS, YS or embryo, indicating SAB. Condolences given. Is open to pregnancy in the future  2. Fibroids Pain with sex mainly with certain positions and with bowel movement, denies constipation. Imaging mentions fibroids, but unclear how many and location. Will plan for  ultrasound and follow up to discuss next steps  - US  PELVIC COMPLETE WITH TRANSVAGINAL; Future   Return in about 1 month (around 06/05/2024) for Fibroid follow-up.   Charlie DELENA Courts, MD 10:59 PM

## 2024-05-16 ENCOUNTER — Encounter

## 2024-06-16 ENCOUNTER — Ambulatory Visit: Admitting: Obstetrics and Gynecology
